# Patient Record
Sex: Male | Born: 1980 | Race: Black or African American | Hispanic: No | Marital: Married | Smoking: Never smoker
Health system: Southern US, Community
[De-identification: ages and names within clinical notes are randomized; demographics above are authoritative.]

## PROBLEM LIST (undated history)

## (undated) DIAGNOSIS — R030 Elevated blood-pressure reading, without diagnosis of hypertension: Secondary | ICD-10-CM

## (undated) DIAGNOSIS — Z8669 Personal history of other diseases of the nervous system and sense organs: Secondary | ICD-10-CM

## (undated) HISTORY — DX: Elevated blood-pressure reading, without diagnosis of hypertension: R03.0

## (undated) HISTORY — DX: Personal history of other diseases of the nervous system and sense organs: Z86.69

---

## 2015-06-15 ENCOUNTER — Other Ambulatory Visit: Payer: Self-pay | Admitting: Occupational Medicine

## 2015-06-15 ENCOUNTER — Ambulatory Visit (HOSPITAL_COMMUNITY)
Admission: RE | Admit: 2015-06-15 | Discharge: 2015-06-15 | Disposition: A | Discharge status: Home / Self Care | Payer: Self-pay | Source: Ambulatory Visit | Attending: Occupational Medicine | Admitting: Occupational Medicine

## 2015-06-15 DIAGNOSIS — A159 Respiratory tuberculosis unspecified: Secondary | ICD-10-CM

## 2016-02-04 ENCOUNTER — Emergency Department (HOSPITAL_COMMUNITY)
Admission: EM | Admit: 2016-02-04 | Discharge: 2016-02-05 | Disposition: A | Discharge status: Home / Self Care | Payer: Worker's Compensation | Attending: Emergency Medicine | Admitting: Emergency Medicine

## 2016-02-04 ENCOUNTER — Encounter (HOSPITAL_COMMUNITY): Payer: Self-pay | Admitting: Emergency Medicine

## 2016-02-04 DIAGNOSIS — S81011A Laceration without foreign body, right knee, initial encounter: Secondary | ICD-10-CM | POA: Diagnosis not present

## 2016-02-04 DIAGNOSIS — IMO0002 Reserved for concepts with insufficient information to code with codable children: Secondary | ICD-10-CM

## 2016-02-04 DIAGNOSIS — Z23 Encounter for immunization: Secondary | ICD-10-CM | POA: Diagnosis not present

## 2016-02-04 DIAGNOSIS — W208XXA Other cause of strike by thrown, projected or falling object, initial encounter: Secondary | ICD-10-CM | POA: Insufficient documentation

## 2016-02-04 DIAGNOSIS — Y9389 Activity, other specified: Secondary | ICD-10-CM | POA: Insufficient documentation

## 2016-02-04 DIAGNOSIS — Y9289 Other specified places as the place of occurrence of the external cause: Secondary | ICD-10-CM | POA: Insufficient documentation

## 2016-02-04 DIAGNOSIS — Y990 Civilian activity done for income or pay: Secondary | ICD-10-CM | POA: Insufficient documentation

## 2016-02-04 NOTE — ED Notes (Signed)
Pt states he was at work and a Midwife block fell on his right leg  Pt has a laceration noted to the right knee  Bleeding controlled  Pt states he is getting where he cannot feel his leg anymore  Pt is able to move his leg but states it hurts

## 2016-02-05 ENCOUNTER — Emergency Department (HOSPITAL_COMMUNITY): Payer: Worker's Compensation

## 2016-02-05 ENCOUNTER — Encounter (HOSPITAL_COMMUNITY): Payer: Self-pay | Admitting: Emergency Medicine

## 2016-02-05 MED ORDER — NAPROXEN 500 MG PO TABS: 500.0000 mg | ORAL_TABLET | Freq: Two times a day (BID) | ORAL | Status: DC

## 2016-02-05 MED ORDER — BACITRACIN ZINC 500 UNIT/GM EX OINT
TOPICAL_OINTMENT | CUTANEOUS | Status: AC
  Filled 2016-02-05: qty 0.9

## 2016-02-05 MED ORDER — LIDOCAINE-EPINEPHRINE-TETRACAINE (LET) SOLUTION
3.0000 mL | Freq: Once | NASAL | Status: AC
  Administered 2016-02-05: 3 mL via TOPICAL
  Filled 2016-02-05: qty 3

## 2016-02-05 MED ORDER — TETANUS-DIPHTH-ACELL PERTUSSIS 5-2.5-18.5 LF-MCG/0.5 IM SUSP
0.5000 mL | Freq: Once | INTRAMUSCULAR | Status: AC
  Administered 2016-02-05: 0.5 mL via INTRAMUSCULAR
  Filled 2016-02-05: qty 0.5

## 2016-02-05 NOTE — ED Notes (Signed)
Pt. Wound wrapped with 4 x 4 gauze and small kerlix with tape applied.

## 2016-02-05 NOTE — ED Provider Notes (Signed)
CSN: [id number]     Arrival date & time 02/04/16  2327 History  By signing my name below, I, Shane Gillespie, attest that this documentation has been prepared under the direction and in the presence of Shane Throckmorton, MD. Electronically Signed: Elon Gillespie, ED Scribe. 02/05/2016. 1:08 AM.    Chief Complaint  Patient presents with  . Leg Injury   Patient is a 35 y.o. male presenting with knee pain. The history is provided by the patient. No language interpreter was used.  Knee Pain Location:  Knee Injury: yes   Mechanism of injury comment:  Butcher block fell on the knee Knee location:  R knee Pain details:    Quality:  Aching   Radiates to:  Does not radiate   Severity:  Moderate   Onset quality:  Sudden   Timing:  Constant   Progression:  Unchanged Chronicity:  New Foreign body present:  No foreign bodies Tetanus status:  Out of date Prior injury to area:  No Relieved by:  Nothing Worsened by:  Nothing tried Ineffective treatments:  None tried Associated symptoms: no back pain, no decreased ROM, no muscle weakness, no stiffness and no tingling   Risk factors: no concern for non-accidental trauma    HPI Comments: Shane Gillespie is a 35 y.o. male who presents to the Emergency Department complaining of right knee injury sustained PTA.  The patient reports a heavy counter top Print production planner block") fell, hitting the right knee and causing a laceration.  Last tetanus unknown.    History reviewed. No pertinent past medical history. History reviewed. No pertinent past surgical history. History reviewed. No pertinent family history. Social History  Substance Use Topics  . Smoking status: Never Smoker   . Smokeless tobacco: None  . Alcohol Use: No    Review of Systems  Musculoskeletal: Negative for back pain and stiffness.  All other systems reviewed and are negative.  A complete 10 system review of systems was obtained and all systems are negative except as noted in the HPI and PMH.    Allergies  Review of patient's allergies indicates no known allergies.  Home Medications   Prior to Admission medications   Not on File   BP 114/83 mmHg  Pulse 80  Temp(Src) 98.4 F (36.9 C) (Oral)  Resp 20  SpO2 100% Physical Exam  Constitutional: He is oriented to person, place, and time. He appears well-developed and well-nourished. No distress.  HENT:  Head: Normocephalic and atraumatic.  Mouth/Throat: Oropharynx is clear and moist.  Eyes: Conjunctivae and EOM are normal. Pupils are equal, round, and reactive to light.  No battle sign or raccoon eyes.   Neck: Normal range of motion. Neck supple. No tracheal deviation present.  Cardiovascular: Normal rate and regular rhythm.   Pulmonary/Chest: Effort normal and breath sounds normal. No respiratory distress.  Abdominal: Soft. Bowel sounds are normal. There is no tenderness. There is no rebound and no guarding.  Musculoskeletal: Normal range of motion.       Right knee: He exhibits laceration. He exhibits normal range of motion, no effusion, no ecchymosis, no erythema, normal alignment, no LCL laxity, normal patellar mobility, no bony tenderness, normal meniscus and no MCL laxity. No tenderness found. No medial joint line, no lateral joint line, no MCL, no LCL and no patellar tendon tenderness noted.       Right ankle: Normal. Achilles tendon normal.       Right lower leg: Normal.       Right foot:  Normal.  NVI intact right foot.  All compartments of right lower extremity are soft.  2+ DP.  Intact achilles.  Sensation intact.  Patella and quadricept tendons intact.    Neurological: He is alert and oriented to person, place, and time. He displays normal reflexes.  Skin: Skin is warm and dry.  1" laceration over the patella on the right.    Psychiatric: He has a normal mood and affect. His behavior is normal.  Nursing note and vitals reviewed.   ED Course  Procedures (including critical care time)  DIAGNOSTIC  STUDIES: Oxygen Saturation is 100% on RA, normal by my interpretation.    COORDINATION OF CARE:  1:15 AM Discussed plan to order imaging of right knee and staple laceration.  Patient acknowledges and agrees with plan.    Labs Review Labs Reviewed - No data to display  Imaging Review No results found. I have personally reviewed and evaluated these images and lab results as part of my medical decision-making.   EKG Interpretation None      MDM   Final diagnoses:  None    LACERATION REPAIR Performed by: Shane Gillespie Authorized by: Shane Gillespie Consent: Verbal consent obtained. Risks and benefits: risks, benefits and alternatives were discussed Consent given by: patient Patient identity confirmed: provided demographic data Prepped and Draped in normal sterile fashion Wound explored  Laceration Location: right knee cap  Laceration Length: 2.54 cm  No Foreign Bodies seen or palpated  Anesthesia: local infiltration  LET topically    Irrigation method: syringe Amount of cleaning: standard  Skin closure: staples  Number of sutures:4   Patient tolerance: Patient tolerated the procedure well with no immediate complications.  I personally performed the services described in this documentation, which was scribed in my presence. The recorded information has been reviewed and is accurate.   Staples removal at urgent care in 10 days   Shane Lipari, MD 02/05/16 [phone]

## 2016-02-14 ENCOUNTER — Encounter (HOSPITAL_COMMUNITY): Payer: Self-pay | Admitting: Emergency Medicine

## 2016-02-14 ENCOUNTER — Emergency Department (INDEPENDENT_AMBULATORY_CARE_PROVIDER_SITE_OTHER)
Admission: EM | Admit: 2016-02-14 | Discharge: 2016-02-14 | Disposition: A | Discharge status: Home / Self Care | Payer: Worker's Compensation | Source: Home / Self Care | Attending: Family Medicine | Admitting: Family Medicine

## 2016-02-14 DIAGNOSIS — S81011S Laceration without foreign body, right knee, sequela: Secondary | ICD-10-CM

## 2016-02-14 NOTE — ED Notes (Signed)
The patient presented to the [facility] to have staples removed from his right thigh that were placed on 02/04/2016 at [facility] ED.

## 2016-02-14 NOTE — Discharge Instructions (Signed)
It was a pleasure to see you today.  The four staples were removed from your right knee.   Please return if you notice pain, redness, or discharge from the wound site.

## 2016-02-14 NOTE — ED Provider Notes (Signed)
CSN: [id number]     Arrival date & time 02/14/16  1602 History   First MD Initiated Contact with Patient 02/14/16 1829     Chief Complaint  Patient presents with  . Suture / Staple Removal   (Consider location/radiation/quality/duration/timing/severity/associated sxs/prior Treatment) Patient is a 35 y.o. male presenting with suture removal. The history is provided by the patient. No language interpreter was used.  Suture / Staple Removal  Patient here for follow up of laceration to RIGHT knee; butcher block fell on his knee 10 days ago.  Had 4 staples placed at that time in the ED.  Here as per instructions to have staples removed.   Has been able to bear weight and walk normally. No pain or discharge, no purulence from the knee.   History reviewed. No pertinent past medical history. History reviewed. No pertinent past surgical history. History reviewed. No pertinent family history. Social History  Substance Use Topics  . Smoking status: Never Smoker   . Smokeless tobacco: None  . Alcohol Use: No    Review of Systems  Constitutional: Negative for fever, chills, activity change and fatigue.  All other systems reviewed and are negative.   Allergies  Review of patient's allergies indicates no known allergies.  Home Medications   Prior to Admission medications   Medication Sig Start Date End Date Taking? Authorizing Provider  naproxen (NAPROSYN) 500 MG tablet Take 1 tablet (500 mg total) by mouth 2 (two) times daily. 02/05/16   April Palumbo, MD   Meds Ordered and Administered this Visit  Medications - No data to display  BP 119/67 mmHg  Pulse 65  Temp(Src) 98.3 F (36.8 C) (Oral)  Resp 16  SpO2 100% No data found.   Physical Exam  Constitutional: He appears well-developed and well-nourished. No distress.  Musculoskeletal:  RIGHT KNEE laceration clean and dry, with 4 staples in place.   Staples removed without difficulty.   Skin: He is not diaphoretic.    ED  Course  Procedures (including critical care time)  Labs Review Labs Reviewed - No data to display  Imaging Review No results found.   Visual Acuity Review  Right Eye Distance:   Left Eye Distance:   Bilateral Distance:    Right Eye Near:   Left Eye Near:    Bilateral Near:         MDM   1. Knee laceration, right, sequela    RIGHT KNEE LACERATION, s/p staples (4).  All 4 staples removed today.  Discussed reasons to return to [facility] for further evaluation.   Paula Compton, MD    Barbaraann Barthel, MD 02/14/16 [phone]

## 2016-02-20 ENCOUNTER — Ambulatory Visit (INDEPENDENT_AMBULATORY_CARE_PROVIDER_SITE_OTHER): Payer: Self-pay | Admitting: Urgent Care

## 2016-02-20 VITALS — BP 124/82 | HR 64 | Temp 98.4°F | Resp 12 | Ht 68.0 in | Wt 213.0 lb

## 2016-02-20 DIAGNOSIS — Z202 Contact with and (suspected) exposure to infections with a predominantly sexual mode of transmission: Secondary | ICD-10-CM

## 2016-02-20 LAB — HIV ANTIBODY (ROUTINE TESTING W REFLEX): HIV 1&2 Ab, 4th Generation: NONREACTIVE

## 2016-02-20 MED ORDER — METRONIDAZOLE 500 MG PO TABS: 500.0000 mg | ORAL_TABLET | Freq: Two times a day (BID) | ORAL | Status: DC

## 2016-02-20 NOTE — Progress Notes (Signed)
    MRN: [id number] DOB: [birth year]  Subjective:   Shane Gillespie is a 35 y.o. male presenting for chief complaint of Exposure to STD  Reports that his wife tested positive for Trichomonas vaginitis last week. Admits intermittent penile discharge. He reports monogamy on his end. Denies fever, genital rashes, n/v, abdominal pain, testicular pain, inguinal pain. He would like complete STI testing today.  Shane Gillespie has a current medication list which includes the following prescription(s): metronidazole and naproxen. Also has No Known Allergies.  Shane Gillespie  has no past medical history on file. Also  has no past surgical history on file.  Objective:   Vitals: BP 124/82 mmHg  Pulse 64  Temp(Src) 98.4 F (36.9 C) (Oral)  Resp 12  Ht  (1.727 m)  Wt 213 lb (96.616 kg)  BMI 32.39 kg/m2  SpO2 98%  Physical Exam  Constitutional: He is oriented to person, place, and time. He appears well-developed and well-nourished.  Cardiovascular: Normal rate.   Pulmonary/Chest: Effort normal.  Genitourinary: Right testis shows no mass, no swelling and no tenderness. Left testis shows no mass, no swelling and no tenderness. Circumcised. No phimosis, paraphimosis, hypospadias, penile erythema or penile tenderness. No discharge found.  Lymphadenopathy: No inguinal adenopathy noted on the right or left side.  Neurological: He is alert and oriented to person, place, and time.  Skin: Skin is warm and dry.   Assessment and Plan :   1. STD exposure 2. Trichomonas contact - Labs pending, start Flagyl to cover for Trichomonas infection. F/u by phone with results.  Wallis Bamberg, PA-C [facility] [phone] 02/20/2016 11:34 AM

## 2016-02-20 NOTE — Patient Instructions (Signed)
Trichomoniasis Trichomoniasis is an infection caused by an organism called Trichomonas. The infection can affect both women and men. In women, the outer male genitalia and the vagina are affected. In men, the penis is mainly affected, but the prostate and other reproductive organs can also be involved. Trichomoniasis is a sexually transmitted infection (STI) and is most often passed to another person through sexual contact.  RISK FACTORS  Having unprotected sexual intercourse.  Having sexual intercourse with an infected partner. SIGNS AND SYMPTOMS  Symptoms of trichomoniasis in women include:  Abnormal gray-green frothy vaginal discharge.  Itching and irritation of the vagina.  Itching and irritation of the area outside the vagina. Symptoms of trichomoniasis in men include:   Penile discharge with or without pain.  Pain during urination. This results from inflammation of the urethra. DIAGNOSIS  Trichomoniasis may be found during a Pap test or physical exam. Your health care provider may use one of the following methods to help diagnose this infection:  Testing the pH of the vagina with a test tape.  Using a vaginal swab test that checks for the Trichomonas organism. A test is available that provides results within a few minutes.  Examining a urine sample.  Testing vaginal secretions. Your health care provider may test you for other STIs, including HIV. TREATMENT   You may be given medicine to fight the infection. Women should inform their health care provider if they could be or are pregnant. Some medicines used to treat the infection should not be taken during pregnancy.  Your health care provider may recommend over-the-counter medicines or creams to decrease itching or irritation.  Your sexual partner will need to be treated if infected.  Your health care provider may test you for infection again 3 months after treatment. HOME CARE INSTRUCTIONS   Take medicines only as  directed by your health care provider.  Take over-the-counter medicine for itching or irritation as directed by your health care provider.  Do not have sexual intercourse while you have the infection.  Women should not douche or wear tampons while they have the infection.  Discuss your infection with your partner. Your partner may have gotten the infection from you, or you may have gotten it from your partner.  Have your sex partner get examined and treated if necessary.  Practice safe, informed, and protected sex.  See your health care provider for other STI testing. SEEK MEDICAL CARE IF:   You still have symptoms after you finish your medicine.  You develop abdominal pain.  You have pain when you urinate.  You have bleeding after sexual intercourse.  You develop a rash.  Your medicine makes you sick or makes you throw up (vomit). MAKE SURE YOU:  Understand these instructions.  Will watch your condition.  Will get help right away if you are not doing well or get worse.   This information is not intended to replace advice given to you by your health care provider. Make sure you discuss any questions you have with your health care provider.   Document Released: 06/07/2001 Document Revised: 01/02/2015 Document Reviewed: 09/23/2013 Elsevier Interactive Patient Education 2016 [employer]  

## 2016-02-22 LAB — GC/CHLAMYDIA PROBE AMP
CT Probe RNA: NOT DETECTED
GC Probe RNA: NOT DETECTED

## 2016-02-24 LAB — FLUORESCENT TREPONEMAL AB(FTA)-IGG-BLD: FLUORESCENT TREPONEMAL ABS: NONREACTIVE

## 2016-02-24 LAB — RPR: RPR: REACTIVE — AB

## 2016-02-24 LAB — RPR TITER: RPR Titer: 1:2 {titer}

## 2016-04-20 ENCOUNTER — Emergency Department (HOSPITAL_COMMUNITY)
Admission: EM | Admit: 2016-04-20 | Discharge: 2016-04-20 | Disposition: A | Discharge status: Home / Self Care | Payer: Worker's Compensation | Attending: Emergency Medicine | Admitting: Emergency Medicine

## 2016-04-20 ENCOUNTER — Encounter (HOSPITAL_COMMUNITY): Payer: Self-pay | Admitting: Emergency Medicine

## 2016-04-20 DIAGNOSIS — M25561 Pain in right knee: Secondary | ICD-10-CM | POA: Insufficient documentation

## 2016-04-20 NOTE — ED Notes (Signed)
Patient with increased pain in right leg for the last month.  He states that about a month ago, something fell on his leg on his right knee at his place of employment.  He states that it is worse when he tries to bend the right knee or kneel on it.

## 2016-04-20 NOTE — ED Provider Notes (Signed)
CSN: [id number]     Arrival date & time 04/20/16  0402 History   First MD Initiated Contact with Patient 04/20/16 0654     Chief Complaint  Patient presents with  . Leg Pain   (Consider location/radiation/quality/duration/timing/severity/associated sxs/prior Treatment) HPI 35 y.o. male presents to the Emergency Department today complaining of continued right leg pain s/p butcher block falling on his knee several weeks ago. Notes pain is 3-4/10 and only occurs with flexion and extension of knee. Has tried tylenol with minimal relief. Pt able to ambulate, but notes discomfort when bending down with the knee to pick things up. No numbness/tingling. No other symptoms noted.   History reviewed. No pertinent past medical history. History reviewed. No pertinent past surgical history. No family history on file. Social History  Substance Use Topics  . Smoking status: Never Smoker   . Smokeless tobacco: None  . Alcohol Use: No    Review of Systems ROS reviewed and all are negative for acute change except as noted in the HPI.  Allergies  Review of patient's allergies indicates no known allergies.  Home Medications   Prior to Admission medications   Medication Sig Start Date End Date Taking? Authorizing Provider  metroNIDAZOLE (FLAGYL) 500 MG tablet Take 1 tablet (500 mg total) by mouth 2 (two) times daily with a meal. DO NOT CONSUME ALCOHOL WHILE TAKING THIS MEDICATION. Patient not taking: Reported on 04/20/2016 02/20/16   Wallis BambergMario Mani, PA-C  naproxen (NAPROSYN) 500 MG tablet Take 1 tablet (500 mg total) by mouth 2 (two) times daily. Patient not taking: Reported on 04/20/2016 02/05/16   April Palumbo, MD   BP 125/64 mmHg  Pulse 86  Temp(Src) 99 F (37.2 C) (Oral)  Resp 18  Ht 5\' 6"  (1.676 m)  Wt 90.719 kg  BMI 32.30 kg/m2  SpO2 94%   Physical Exam  Constitutional: He is oriented to person, place, and time. He appears well-developed and well-nourished.  HENT:  Head: Normocephalic and  atraumatic.  Eyes: EOM are normal.  Neck: Normal range of motion.  Cardiovascular: Normal rate and regular rhythm.   Pulmonary/Chest: Effort normal.  Abdominal: Soft.  Musculoskeletal: Normal range of motion.       Right knee: He exhibits normal range of motion, no swelling, no effusion, no ecchymosis, no deformity, no laceration, no erythema, normal alignment, no LCL laxity, normal patellar mobility and no bony tenderness. No tenderness found. No medial joint line tenderness noted.  Negative anterior/poster drawer bilaterally. Negative ballottement test. No varus or valgus laxity. No crepitus. Pain with flexion and extension. TTP along supra patella where laceration scar is noted. Distal pulses intact. Cap refill <2sec. Motor/sensation intact    Neurological: He is alert and oriented to person, place, and time.  Skin: Skin is warm and dry.  Psychiatric: He has a normal mood and affect. His behavior is normal. Thought content normal.  Nursing note and vitals reviewed.  ED Course  Procedures (including critical care time) Labs Review Labs Reviewed - No data to display  Imaging Review No results found. I have personally reviewed and evaluated these images and lab results as part of my medical decision-making.   EKG Interpretation None      MDM  I have reviewed and evaluated the relevant imaging studies.  I have reviewed the relevant previous healthcare records. I obtained HPI from historian.  ED Course:  Assessment: Pt is a 35yM who presents with continued right knee pain x 1 month s/p blcok falling on knee.  On exam, pt in NAD. Nontoxic/nonseptic appearing. VSS. Afebrile. Suspect meniscal injury due to pain with ROM of knee. No pain at rest. Pt has not tried antiinflammatory at this time. Will suggest NSAIDs. Given Ortho referral if pain does not resolve with NSAID treatment. Previous imaging unremarkable with no acute fractures. Plan is to DC Home. At time of discharge, Patient is in  no acute distress. Vital Signs are stable. Patient is able to ambulate. Patient able to tolerate PO.    Disposition/Plan:  DC home Additional Verbal discharge instructions given and discussed with patient.  Pt Instructed to f/u with PCP in the next week for evaluation and treatment of symptoms. Return precautions given Pt acknowledges and agrees with plan  Supervising Physician Jerelyn Scott, MD   Final diagnoses:  Right knee pain       Audry Pili, PA-C 04/20/16 7829  Jerelyn Scott, MD 04/20/16 [phone]

## 2016-04-20 NOTE — Discharge Instructions (Signed)
Please read and follow all provided instructions.  Your diagnoses today include:  1. Right knee pain    Tests performed today include:  Vital signs. See below for your results today.   Medications prescribed:   None  Home care instructions:  Follow any educational materials contained in this packet.  Follow-up instructions: Please follow-up with Orthopedics for further evaluation of symptoms and treatment if symptoms do not improve with NSAID treatment   Return instructions:   Please return to the Emergency Department if you do not get better, if you get worse, or new symptoms OR  - Fever (temperature greater than 101.34F)  - Bleeding that does not stop with holding pressure to the area    -Severe pain (please note that you may be more sore the day after your accident)  - Chest Pain  - Difficulty breathing  - Severe nausea or vomiting  - Inability to tolerate food and liquids  - Passing out  - Skin becoming red around your wounds  - Change in mental status (confusion or lethargy)  - New numbness or weakness     Please return if you have any other emergent concerns.  Additional Information:  Your vital signs today were: BP 125/64 mmHg   Pulse 86   Temp(Src) 99 F (37.2 C) (Oral)   Resp 18   Ht 5\' 6"  (1.676 m)   Wt 90.719 kg   BMI 32.30 kg/m2   SpO2 94% If your blood pressure (BP) was elevated above 135/85 this visit, please have this repeated by your doctor within one month. ---------------

## 2017-08-15 IMAGING — CR DG KNEE COMPLETE 4+V*R*
4 series · 4 of 4 positions shown · non-contrast
Comparison: None.

CLINICAL DATA: [place] Mugesi fell and struck patient on right knee,
with laceration across the patella. Initial encounter.

EXAM:
RIGHT KNEE - COMPLETE 4+ VIEW

[x knee ap right]
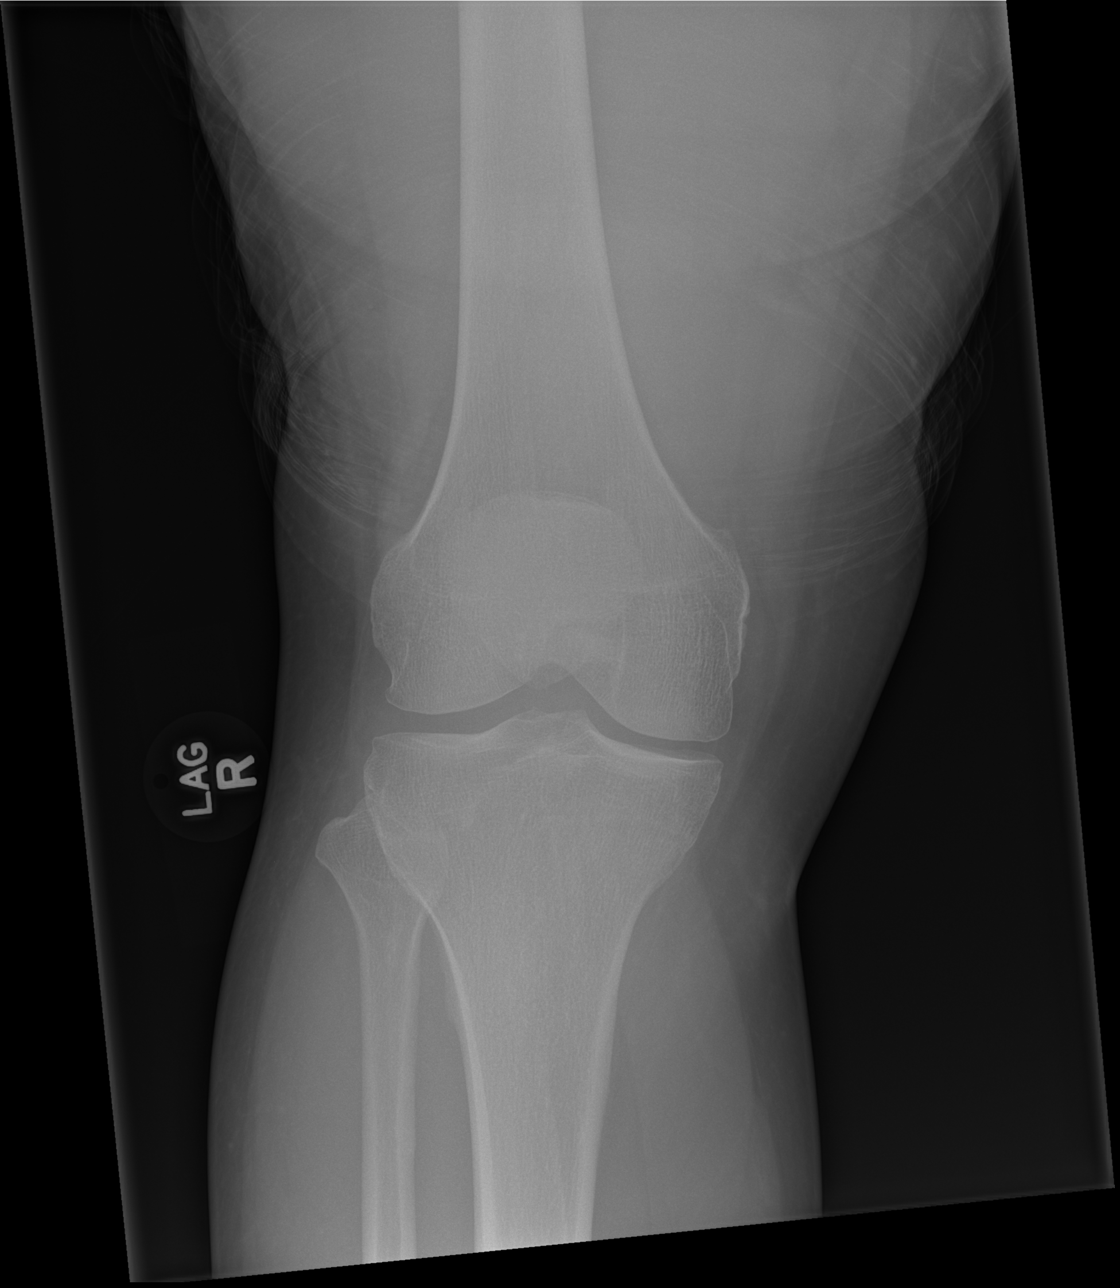

[x knee obl right (1 of 2)]
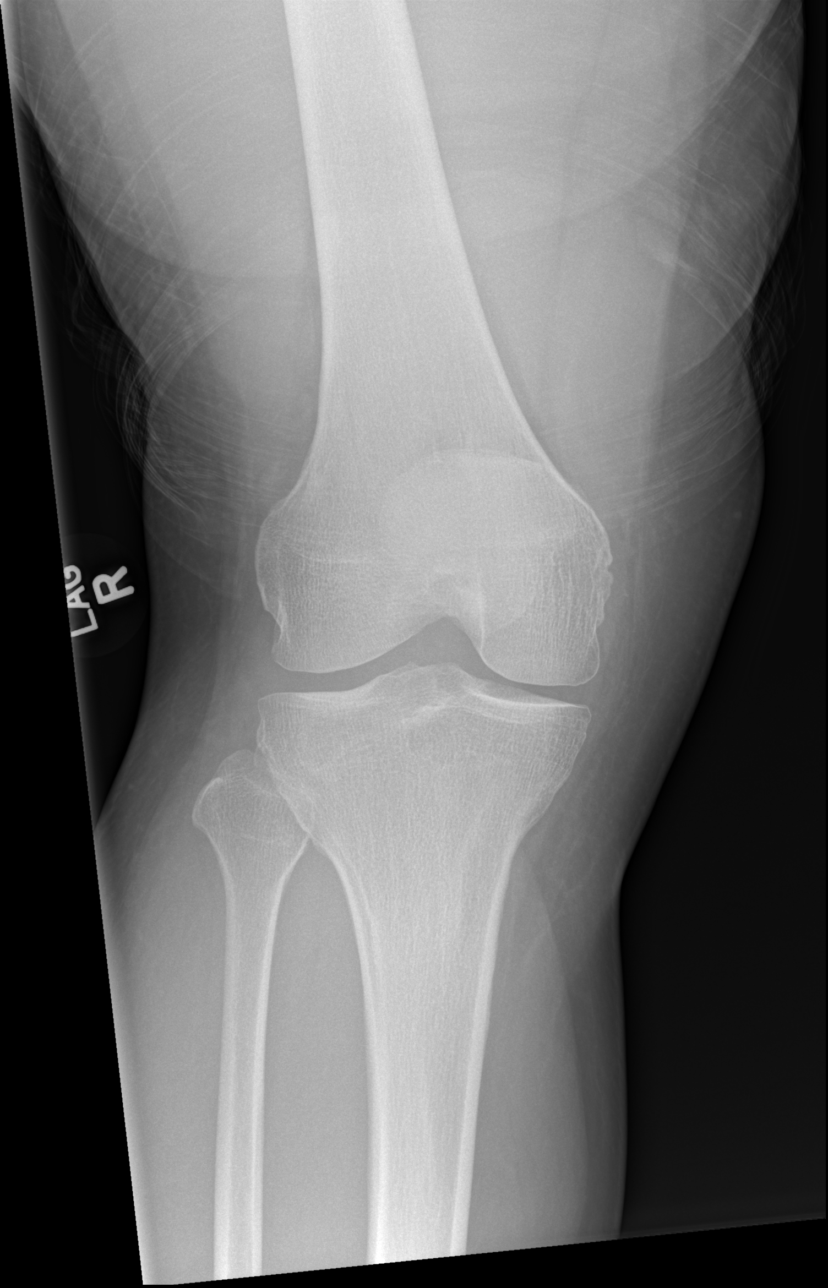

[x knee obl right (2 of 2)]
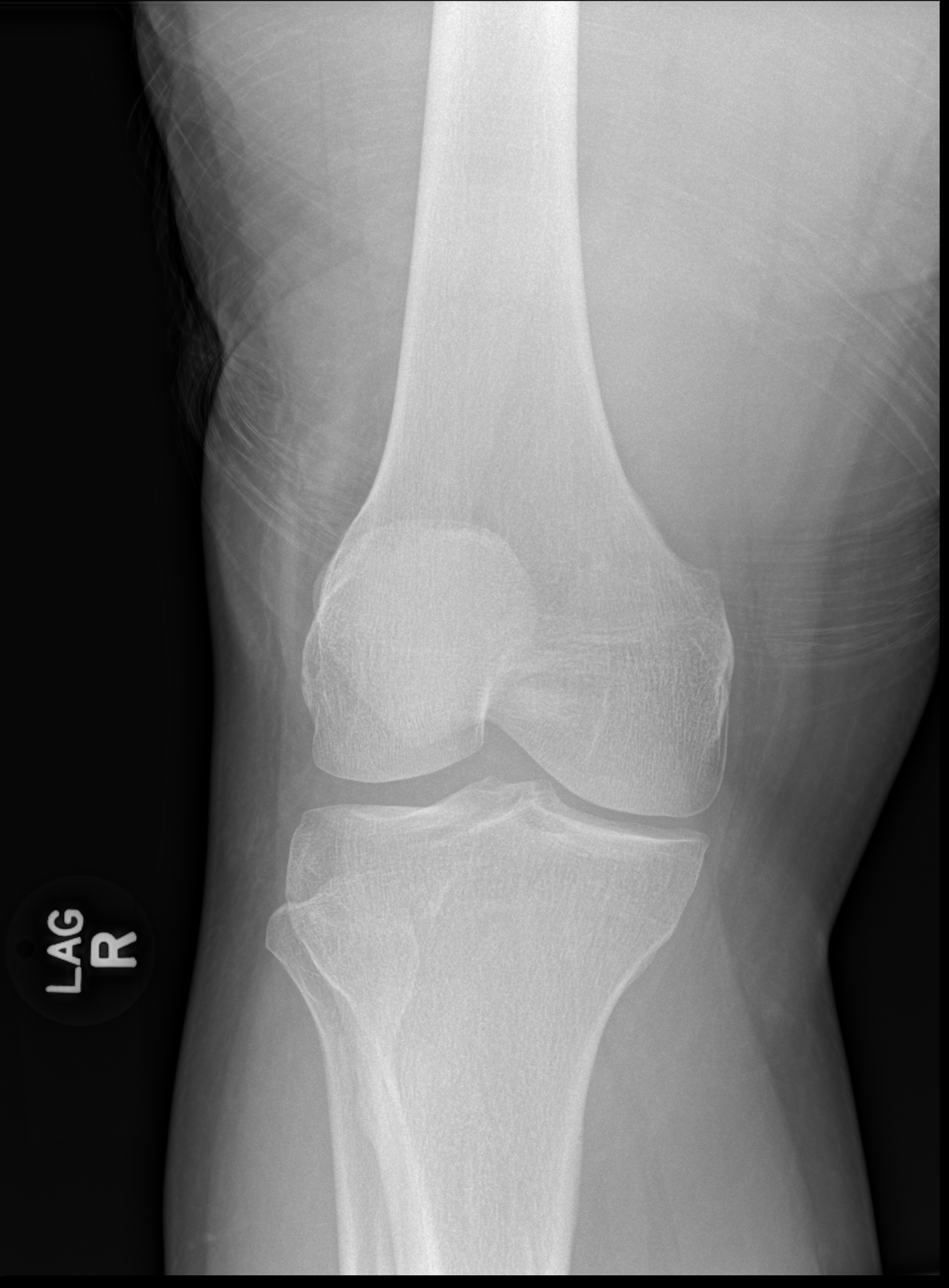

[x knee lat right]
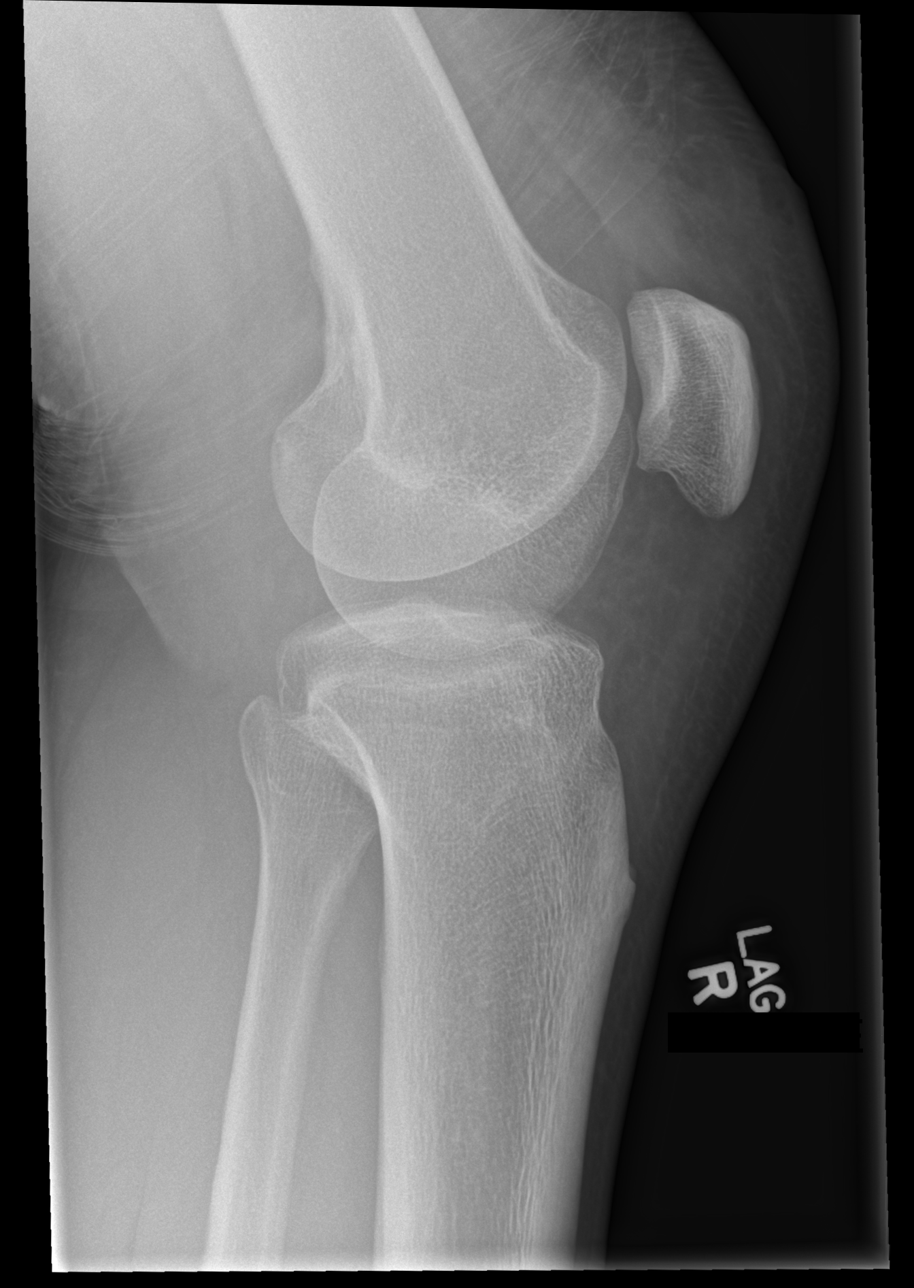

[4 of 4 positions shown; findings below may reference images not displayed]

FINDINGS: There is no evidence of fracture or dislocation. The joint spaces
are preserved. No significant degenerative change is seen; the
patellofemoral joint is grossly unremarkable in appearance.

No significant joint effusion is seen. The known soft tissue
laceration is not well characterized on radiograph. No radiopaque
foreign bodies are seen.
IMPRESSION: No evidence of fracture or dislocation.

## 2019-06-25 ENCOUNTER — Ambulatory Visit: Payer: Self-pay | Admitting: Registered Nurse

## 2019-07-09 ENCOUNTER — Ambulatory Visit (INDEPENDENT_AMBULATORY_CARE_PROVIDER_SITE_OTHER): Payer: Managed Care, Other (non HMO) | Admitting: Registered Nurse

## 2019-07-09 ENCOUNTER — Other Ambulatory Visit: Payer: Self-pay

## 2019-07-09 ENCOUNTER — Encounter: Payer: Self-pay | Admitting: Registered Nurse

## 2019-07-09 VITALS — BP 113/70 | HR 68 | Temp 98.0°F | Resp 18 | Ht 67.91 in | Wt 224.0 lb

## 2019-07-09 DIAGNOSIS — Z0000 Encounter for general adult medical examination without abnormal findings: Secondary | ICD-10-CM

## 2019-07-09 DIAGNOSIS — Z130 Encounter for screening for diseases of the blood and blood-forming organs and certain disorders involving the immune mechanism: Secondary | ICD-10-CM

## 2019-07-09 DIAGNOSIS — Z13220 Encounter for screening for lipoid disorders: Secondary | ICD-10-CM | POA: Diagnosis not present

## 2019-07-09 DIAGNOSIS — Z13228 Encounter for screening for other metabolic disorders: Secondary | ICD-10-CM

## 2019-07-09 DIAGNOSIS — L0292 Furuncle, unspecified: Secondary | ICD-10-CM | POA: Insufficient documentation

## 2019-07-09 DIAGNOSIS — L0293 Carbuncle, unspecified: Secondary | ICD-10-CM | POA: Diagnosis not present

## 2019-07-09 DIAGNOSIS — Z0001 Encounter for general adult medical examination with abnormal findings: Secondary | ICD-10-CM | POA: Diagnosis not present

## 2019-07-09 DIAGNOSIS — Z1329 Encounter for screening for other suspected endocrine disorder: Secondary | ICD-10-CM

## 2019-07-09 MED ORDER — DOXYCYCLINE HYCLATE 100 MG PO TABS: 100.0000 mg | ORAL_TABLET | Freq: Two times a day (BID) | ORAL | 2 refills | Status: DC

## 2019-07-09 NOTE — Progress Notes (Signed)
Established Patient Office Visit  Subjective:  Patient ID: Shane Gillespie, male    DOB: 1981/08/05  Age: 38 y.o. MRN: [id number]  CC:  Chief Complaint  Patient presents with  . Establish Care    needs a pcp to manage care     HPI Shane Gillespie presents for visit to establish care.   Complains today of boil on his leg - he states he has gotten them in his armpits before and has been on metronidazole in the past for them. He states it has had some purulent discharge and has felt warm. Denies systemic symptoms.  Otherwise, reports that he feels in good health. Works at [employer] in a physical job, states that he has occasional back pain that he thinks is muscular in nature. Denies imaging or PT referral today. We discussed lower back stretching.  History reviewed. No pertinent past medical history.  History reviewed. No pertinent surgical history.  History reviewed. No pertinent family history.  Social History   Socioeconomic History  . Marital status: Married    Spouse name: Not on file  . Number of children: 3  . Years of education: Not on file  . Highest education level: Not on file  Occupational History  . Not on file  Social Needs  . Financial resource strain: Not hard at all  . Food insecurity    Worry: Never true    Inability: Never true  . Transportation needs    Medical: No    Non-medical: No  Tobacco Use  . Smoking status: Never Smoker  . Smokeless tobacco: Never Used  Substance and Sexual Activity  . Alcohol use: No  . Drug use: No  . Sexual activity: Not Currently  Lifestyle  . Physical activity    Days per week: 3 days    Minutes per session: 30 min  . Stress: To some extent  Relationships  . Social Herbalist on phone: Three times a week    Gets together: Twice a week    Attends religious service: More than 4 times per year    Active member of club or organization: No    Attends meetings of clubs or organizations: Never   Relationship status: Married  . Intimate partner violence    Fear of current or ex partner: No    Emotionally abused: No    Physically abused: No    Forced sexual activity: No  Other Topics Concern  . Not on file  Social History Narrative  . Not on file    Outpatient Medications Prior to Visit  Medication Sig Dispense Refill  . metroNIDAZOLE (FLAGYL) 500 MG tablet Take 1 tablet (500 mg total) by mouth 2 (two) times daily with a meal. DO NOT CONSUME ALCOHOL WHILE TAKING THIS MEDICATION. (Patient not taking: Reported on 04/20/2016) 14 tablet 0  . naproxen (NAPROSYN) 500 MG tablet Take 1 tablet (500 mg total) by mouth 2 (two) times daily. (Patient not taking: Reported on 04/20/2016) 30 tablet 0   No facility-administered medications prior to visit.     No Known Allergies  ROS Review of Systems  Constitutional: Negative.   HENT: Negative.   Eyes: Negative.   Respiratory: Negative.   Cardiovascular: Negative.   Gastrointestinal: Negative.   Endocrine: Negative.   Genitourinary: Negative.   Musculoskeletal: Negative.   Skin: Positive for wound.  Allergic/Immunologic: Negative.   Neurological: Negative.   Hematological: Negative.   Psychiatric/Behavioral: Negative.   All other systems reviewed and  are negative.     Objective:    Physical Exam  Constitutional: He is oriented to person, place, and time. He appears well-developed and well-nourished. No distress.  HENT:  Head: Normocephalic and atraumatic.  Right Ear: External ear normal.  Left Ear: External ear normal.  Nose: Nose normal.  Mouth/Throat: No oropharyngeal exudate.  Eyes: Pupils are equal, round, and reactive to light. Conjunctivae and EOM are normal. Right eye exhibits no discharge. Left eye exhibits no discharge. No scleral icterus.  Neck: Normal range of motion. Neck supple. No tracheal deviation present. No thyromegaly present.  Cardiovascular: Normal rate, regular rhythm, normal heart sounds and intact  distal pulses. Exam reveals no gallop and no friction rub.  No murmur heard. Pulmonary/Chest: Effort normal and breath sounds normal. No respiratory distress. He has no wheezes. He has no rales. He exhibits no tenderness.  Abdominal: Soft. Bowel sounds are normal. He exhibits no distension and no mass. There is no abdominal tenderness. There is no rebound and no guarding.  Musculoskeletal: Normal range of motion.        General: No tenderness, deformity or edema.  Lymphadenopathy:    He has no cervical adenopathy.  Neurological: He is alert and oriented to person, place, and time. No cranial nerve deficit.  Skin: Skin is warm and dry. No rash noted. He is not diaphoretic. No erythema. No pallor.     Psychiatric: He has a normal mood and affect. His behavior is normal. Judgment and thought content normal.  Nursing note and vitals reviewed.   BP 113/70   Pulse 68   Temp 98 F (36.7 C) (Oral)   Resp 18   Ht 5' 7.91" (1.725 m)   Wt 224 lb (101.6 kg)   SpO2 98%   BMI 34.15 kg/m  Wt Readings from Last 3 Encounters:  07/09/19 224 lb (101.6 kg)  04/20/16 200 lb (90.7 kg)  02/20/16 213 lb (96.6 kg)     There are no preventive care reminders to display for this patient.  There are no preventive care reminders to display for this patient.  No results found for: TSH No results found for: WBC, HGB, HCT, MCV, PLT No results found for: NA, K, CHLORIDE, CO2, GLUCOSE, BUN, CREATININE, BILITOT, ALKPHOS, AST, ALT, PROT, ALBUMIN, CALCIUM, ANIONGAP, EGFR, GFR No results found for: CHOL No results found for: HDL No results found for: LDLCALC No results found for: TRIG No results found for: CHOLHDL No results found for: HGBA1C    Assessment & Plan:   Problem List Items Addressed This Visit      Other   Recurrent boils   Relevant Medications   doxycycline (VIBRA-TABS) 100 MG tablet    Other Visit Diagnoses    Screening for endocrine, metabolic and immunity disorder    -  Primary    Relevant Orders   CBC with Differential/Platelet   Comprehensive metabolic panel   TSH   Lipid screening       Relevant Orders   Lipid panel      Meds ordered this encounter  Medications  . doxycycline (VIBRA-TABS) 100 MG tablet    Sig: Take 1 tablet (100 mg total) by mouth 2 (two) times daily. For 10 days when boil presents    Dispense:  20 tablet    Refill:  2    Order Specific Question:   Supervising Provider    Answer:   Forrest Moron [id number]    Follow-up: No follow-ups on file.   PLAN  Doxycycline '100mg'$  PO bid for 10 days for recurrent boils. Considered lancing, however, given physical nature of patient's job and past need for abx, decided abx would be preferable. Given 2 refills in case of recurrence.  Routine labs drawn, will follow up as warranted.  Physical exam with normal findings.  Patient encouraged to call clinic with any questions, comments, or concerns.    Maximiano Coss, NP

## 2019-07-09 NOTE — Patient Instructions (Signed)
° ° ° °  If you have lab work done today you will be contacted with your lab results within the next 2 weeks.  If you have not heard from us then please contact us. The fastest way to get your results is to register for My Chart. ° ° °IF you received an x-ray today, you will receive an invoice from [facility]. Please contact [facility] at [phone] with questions or concerns regarding your invoice.  ° °IF you received labwork today, you will receive an invoice from [facility]. Please contact [facility] at [phone] with questions or concerns regarding your invoice.  ° °Our billing staff will not be able to assist you with questions regarding bills from these companies. ° °You will be contacted with the lab results as soon as they are available. The fastest way to get your results is to activate your My Chart account. Instructions are located on the last page of this paperwork. If you have not heard from us regarding the results in 2 weeks, please contact this office. °  ° ° ° °

## 2019-07-10 LAB — CBC WITH DIFFERENTIAL/PLATELET
Basophils Absolute: 0 10*3/uL (ref 0.0–0.2)
Basos: 1 %
EOS (ABSOLUTE): 0.3 10*3/uL (ref 0.0–0.4)
Eos: 6 %
Hematocrit: 41.6 % (ref 37.5–51.0)
Hemoglobin: 13.5 g/dL (ref 13.0–17.7)
Immature Grans (Abs): 0 10*3/uL (ref 0.0–0.1)
Immature Granulocytes: 0 %
Lymphocytes Absolute: 2.2 10*3/uL (ref 0.7–3.1)
Lymphs: 48 %
MCH: 26.8 pg (ref 26.6–33.0)
MCHC: 32.5 g/dL (ref 31.5–35.7)
MCV: 83 fL (ref 79–97)
Monocytes Absolute: 0.5 10*3/uL (ref 0.1–0.9)
Monocytes: 11 %
Neutrophils Absolute: 1.5 10*3/uL (ref 1.4–7.0)
Neutrophils: 34 %
Platelets: 216 10*3/uL (ref 150–450)
RBC: 5.03 x10E6/uL (ref 4.14–5.80)
RDW: 13.6 % (ref 11.6–15.4)
WBC: 4.5 10*3/uL (ref 3.4–10.8)

## 2019-07-10 LAB — LIPID PANEL
Chol/HDL Ratio: 2.9 ratio (ref 0.0–5.0)
Cholesterol, Total: 164 mg/dL (ref 100–199)
HDL: 56 mg/dL (ref >39)
LDL Calculated: 98 mg/dL (ref 0–99)
Triglycerides: 51 mg/dL (ref 0–149)
VLDL Cholesterol Cal: 10 mg/dL (ref 5–40)

## 2019-07-10 LAB — COMPREHENSIVE METABOLIC PANEL
ALT: 28 IU/L (ref 0–44)
AST: 27 IU/L (ref 0–40)
Albumin/Globulin Ratio: 1.4 (ref 1.2–2.2)
Albumin: 4.1 g/dL (ref 4.0–5.0)
Alkaline Phosphatase: 61 IU/L (ref 39–117)
BUN/Creatinine Ratio: 9 (ref 9–20)
BUN: 10 mg/dL (ref 6–20)
Bilirubin Total: <0.2 mg/dL (ref 0.0–1.2)
CO2: 22 mmol/L (ref 20–29)
Calcium: 8.9 mg/dL (ref 8.7–10.2)
Chloride: 102 mmol/L (ref 96–106)
Creatinine, Ser: 1.1 mg/dL (ref 0.76–1.27)
GFR calc Af Amer: 99 mL/min/{1.73_m2} (ref >59)
GFR calc non Af Amer: 85 mL/min/{1.73_m2} (ref >59)
Globulin, Total: 3 g/dL (ref 1.5–4.5)
Glucose: 94 mg/dL (ref 65–99)
Potassium: 3.9 mmol/L (ref 3.5–5.2)
Sodium: 140 mmol/L (ref 134–144)
Total Protein: 7.1 g/dL (ref 6.0–8.5)

## 2019-07-10 LAB — TSH: TSH: 2.76 u[IU]/mL (ref 0.450–4.500)

## 2020-04-13 ENCOUNTER — Ambulatory Visit: Payer: Self-pay | Admitting: Registered Nurse

## 2020-04-16 ENCOUNTER — Ambulatory Visit: Payer: Managed Care, Other (non HMO) | Attending: Internal Medicine

## 2020-04-16 DIAGNOSIS — Z23 Encounter for immunization: Secondary | ICD-10-CM

## 2020-04-16 NOTE — Progress Notes (Signed)
   Covid-19 Vaccination Clinic  Name:  Shane Gillespie    MRN: [id number] DOB: 1981-11-09  04/16/2020  Shane Gillespie was observed post Covid-19 immunization for 15 minutes without incident. He was provided with Vaccine Information Sheet and instruction to access the V-Safe system.   Shane Gillespie was instructed to call 911 with any severe reactions post vaccine: Marland Kitchen Difficulty breathing  . Swelling of face and throat  . A fast heartbeat  . A bad rash all over body  . Dizziness and weakness   Immunizations Administered    Name Date Dose VIS Date Route   Pfizer COVID-19 Vaccine 04/16/2020 12:10 PM 0.3 mL 02/19/2019 Intramuscular   Manufacturer: [employer], [employer]   Lot: [id number]   NDC: [address]-7

## 2020-05-11 ENCOUNTER — Ambulatory Visit: Payer: Managed Care, Other (non HMO) | Attending: Internal Medicine

## 2020-05-11 DIAGNOSIS — Z23 Encounter for immunization: Secondary | ICD-10-CM

## 2020-05-11 NOTE — Progress Notes (Signed)
   Covid-19 Vaccination Clinic  Name:  Shane Gillespie    MRN: [id number] DOB: 1981/06/07  05/11/2020  [name] was observed post Covid-19 immunization for 15 minutes without incident. He was provided with Vaccine Information Sheet and instruction to access the V-Safe system.   [name] was instructed to call 911 with any severe reactions post vaccine: Marland Kitchen Difficulty breathing  . Swelling of face and throat  . A fast heartbeat  . A bad rash all over body  . Dizziness and weakness   Immunizations Administered    Name Date Dose VIS Date Route   Pfizer COVID-19 Vaccine 05/11/2020  8:30 AM 0.3 mL 02/19/2019 Intramuscular   Manufacturer: [employer], [employer]   Lot: [id number]   NDC: [address]-1

## 2020-05-19 ENCOUNTER — Other Ambulatory Visit: Payer: Self-pay

## 2020-05-19 ENCOUNTER — Encounter: Payer: Self-pay | Admitting: Registered Nurse

## 2020-05-19 ENCOUNTER — Telehealth (INDEPENDENT_AMBULATORY_CARE_PROVIDER_SITE_OTHER): Payer: Managed Care, Other (non HMO) | Admitting: Registered Nurse

## 2020-05-19 DIAGNOSIS — J069 Acute upper respiratory infection, unspecified: Secondary | ICD-10-CM | POA: Diagnosis not present

## 2020-05-19 MED ORDER — AZITHROMYCIN 250 MG PO TABS: ORAL_TABLET | ORAL | 0 refills | Status: DC

## 2020-05-19 NOTE — Progress Notes (Signed)
Telemedicine Encounter- SOAP NOTE Established Patient  This telephone encounter was conducted with the patient's (or proxy's) verbal consent via audio telecommunications: yes  Patient was instructed to have this encounter in a suitably private space; and to only have persons present to whom they give permission to participate. In addition, patient identity was confirmed by use of name plus two identifiers (DOB and address).  I discussed the limitations, risks, security and privacy concerns of performing an evaluation and management service by telephone and the availability of in person appointments. I also discussed with the patient that there may be a patient responsible charge related to this service. The patient expressed understanding and agreed to proceed.  I spent a total of 13 minutes talking with the patient or their proxy.  Chief Complaint  Patient presents with  . Covid Vaccine     patient states 5/17 afterwards he has been having sore throat , migraine , and a fever keeps coming and going since then. Per patient he has been feeling very fatigue and weak. He has been taking tylenol to help and feels like its not working a he say he is abusing the medication    Subjective   Shane Gillespie is a 39 y.o. established patient. Telephone visit today for vaccine side effects  HPI Second dose of covid vaccine on 05/11/20.  Since then having some sore throat, headaches, and feeling feverish. Feeling weak and fatigued. No nvd, chills, sweats, visual changes, chest pain, shob, doe, dependent edema, or claudication. Had mild flu like symptoms after first shot. No local rash or swelling at injection site. Arm soreness resolved within a day or two.  No hx of seasonal allergies.   Patient Active Problem List   Diagnosis Date Noted  . 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 No pertinent past medical history.  Current Outpatient Medications  Medication Sig Dispense Refill  .  doxycycline (VIBRA-TABS) 100 MG tablet Take 1 tablet (100 mg total) by mouth 2 (two) times daily. For 10 days when boil presents (Patient not taking: Reported on 05/19/2020) 20 tablet 2   No current facility-administered medications for this visit.    No Known Allergies  Social History   Socioeconomic History  . Marital status: Married    Spouse name: Not on file  . Number of children: 3  . Years of education: Not on file  . Highest education level: Not on file  Occupational History  . Not on file  Tobacco Use  . Smoking status: Never Smoker  . Smokeless tobacco: Never Used  Substance and Sexual Activity  . Alcohol use: No  . Drug use: No  . Sexual activity: Not Currently  Other Topics Concern  . Not on file  Social History Narrative  . Not on file   Social Determinants of Health   Financial Resource Strain: Low Risk   . Difficulty of Paying Living Expenses: Not hard at all  Food Insecurity: No Food Insecurity  . Worried About Charity fundraiser in the Last Year: Never true  . Ran Out of Food in the Last Year: Never true  Transportation Needs: No Transportation Needs  . Lack of Transportation (Medical): No  . Lack of Transportation (Non-Medical): No  Physical Activity: Insufficiently Active  . Days of Exercise per Week: 3 days  . Minutes of Exercise per Session: 30 min  Stress: Stress Concern Present  . Feeling of Stress : To some extent  Social Connections: Slightly Isolated  .  Frequency of Communication with Friends and Family: Three times a week  . Frequency of Social Gatherings with Friends and Family: Twice a week  . Attends Religious Services: More than 4 times per year  . Active Member of Clubs or Organizations: No  . Attends Banker Meetings: Never  . Marital Status: Married  Catering manager Violence: Not At Risk  . Fear of Current or Ex-Partner: No  . Emotionally Abused: No  . Physically Abused: No  . Sexually Abused: No    Review of  Systems  Constitutional: Positive for fever and malaise/fatigue. Negative for diaphoresis and weight loss.  HENT: Positive for sinus pain and sore throat. Negative for congestion, ear discharge, ear pain, hearing loss, nosebleeds and tinnitus.   Eyes: Negative.   Respiratory: Negative.  Negative for stridor.   Cardiovascular: Negative.   Gastrointestinal: Negative.   Genitourinary: Negative.   Musculoskeletal: Negative.   Skin: Negative.   Neurological: Positive for headaches. Negative for dizziness, tingling, tremors, sensory change, speech change, focal weakness, seizures, loss of consciousness and weakness.  Endo/Heme/Allergies: Negative.   Psychiatric/Behavioral: Negative.   All other systems reviewed and are negative.   Objective   Vitals as reported by the patient: There were no vitals filed for this visit.  [name] was seen today for covid vaccine .  Diagnoses and all orders for this visit:  Acute upper respiratory infection -     azithromycin (ZITHROMAX) 250 MG tablet; Take 2 tabs on first day. Then take 1 daily. Finish entire supply.   PLAN  Concern for upper respiratory infection. Given z pak  Suggest tylenol 500mg  PO q4h regularly for next 3-4 days  Return precautions reviewed  Patient encouraged to call clinic with any questions, comments, or concerns.  I discussed the assessment and treatment plan with the patient. The patient was provided an opportunity to ask questions and all were answered. The patient agreed with the plan and demonstrated an understanding of the instructions.   The patient was advised to call back or seek an in-person evaluation if the symptoms worsen or if the condition fails to improve as anticipated.  I provided 13 minutes of non-face-to-face time during this encounter.  , NP  [facility] at [facility]

## 2020-05-19 NOTE — Patient Instructions (Signed)
° ° ° °  If you have lab work done today you will be contacted with your lab results within the next 2 weeks.  If you have not heard from us then please contact us. The fastest way to get your results is to register for My Chart. ° ° °IF you received an x-ray today, you will receive an invoice from [facility]. Please contact [facility] at [phone] with questions or concerns regarding your invoice.  ° °IF you received labwork today, you will receive an invoice from [facility]. Please contact [facility] at [phone] with questions or concerns regarding your invoice.  ° °Our billing staff will not be able to assist you with questions regarding bills from these companies. ° °You will be contacted with the lab results as soon as they are available. The fastest way to get your results is to activate your My Chart account. Instructions are located on the last page of this paperwork. If you have not heard from us regarding the results in 2 weeks, please contact this office. °  ° ° ° °

## 2020-06-04 ENCOUNTER — Encounter: Payer: Self-pay | Admitting: *Deleted

## 2020-06-08 ENCOUNTER — Ambulatory Visit (INDEPENDENT_AMBULATORY_CARE_PROVIDER_SITE_OTHER): Payer: Managed Care, Other (non HMO) | Admitting: Registered Nurse

## 2020-06-08 ENCOUNTER — Encounter: Payer: Self-pay | Admitting: Registered Nurse

## 2020-06-08 ENCOUNTER — Other Ambulatory Visit: Payer: Self-pay

## 2020-06-08 VITALS — BP 117/78 | HR 70 | Temp 97.9°F | Resp 18 | Ht 67.0 in | Wt 240.4 lb

## 2020-06-08 DIAGNOSIS — H5710 Ocular pain, unspecified eye: Secondary | ICD-10-CM | POA: Diagnosis not present

## 2020-06-08 DIAGNOSIS — Q165 Congenital malformation of inner ear: Secondary | ICD-10-CM | POA: Diagnosis not present

## 2020-06-08 DIAGNOSIS — R42 Dizziness and giddiness: Secondary | ICD-10-CM | POA: Diagnosis not present

## 2020-06-08 MED ORDER — ERYTHROMYCIN 5 MG/GM OP OINT: 1.0000 "application " | TOPICAL_OINTMENT | Freq: Three times a day (TID) | OPHTHALMIC | 0 refills | Status: DC

## 2020-06-08 MED ORDER — MECLIZINE HCL 12.5 MG PO TABS: 12.5000 mg | ORAL_TABLET | Freq: Three times a day (TID) | ORAL | 0 refills | Status: DC | PRN

## 2020-06-08 NOTE — Patient Instructions (Signed)
° ° ° °  If you have lab work done today you will be contacted with your lab results within the next 2 weeks.  If you have not heard from us then please contact us. The fastest way to get your results is to register for My Chart. ° ° °IF you received an x-ray today, you will receive an invoice from [facility]. Please contact [facility] at [phone] with questions or concerns regarding your invoice.  ° °IF you received labwork today, you will receive an invoice from [facility]. Please contact [facility] at [phone] with questions or concerns regarding your invoice.  ° °Our billing staff will not be able to assist you with questions regarding bills from these companies. ° °You will be contacted with the lab results as soon as they are available. The fastest way to get your results is to activate your My Chart account. Instructions are located on the last page of this paperwork. If you have not heard from us regarding the results in 2 weeks, please contact this office. °  ° ° ° °

## 2020-06-08 NOTE — Progress Notes (Signed)
Acute Office Visit  Subjective:    Patient ID: Shane Gillespie, male    DOB: 01-21-1981, 39 y.o.   MRN: [id number]  Chief Complaint  Patient presents with  . Dizziness    patient states he was up walking this morning and out of nowhere he felt like he was going to faint. Per patient this has never happened before  . Eye Problem    Patient was using clear eyes for his eyes now it feels like something is in right eye    HPI Patient is in today for dizziness  Works third shift. At around 2am, noted sudden dizziness, felt the room was spinning around him. Was able to sit down and feel stable within 1-2 minutes. He had not had episodes like this before or since. Denies chest pain, shob, doe, palpitations, nvd, dependent edema, claudication, history of vertigo, poor PO intake, or other symptoms/factors. Feeling well now.  Also notes that he has scratched his eye - concerned for foreign body from work, he had been itching his eye a lot. No foreign body but feels swollen, tender.  No past medical history on file.  No past surgical history on file.  No family history on file.  Social History   Socioeconomic History  . Marital status: Married    Spouse name: Not on file  . Number of children: 3  . Years of education: Not on file  . Highest education level: Not on file  Occupational History  . Not on file  Tobacco Use  . Smoking status: Never Smoker  . Smokeless tobacco: Never Used  Vaping Use  . Vaping Use: Never used  Substance and Sexual Activity  . Alcohol use: No  . Drug use: No  . Sexual activity: Not Currently  Other Topics Concern  . Not on file  Social History Narrative  . Not on file   Social Determinants of Health   Financial Resource Strain: Low Risk   . Difficulty of Paying Living Expenses: Not hard at all  Food Insecurity: No Food Insecurity  . Worried About Charity fundraiser in the Last Year: Never true  . Ran Out of Food in the Last Year: Never true    Transportation Needs: No Transportation Needs  . Lack of Transportation (Medical): No  . Lack of Transportation (Non-Medical): No  Physical Activity: Insufficiently Active  . Days of Exercise per Week: 3 days  . Minutes of Exercise per Session: 30 min  Stress: Stress Concern Present  . Feeling of Stress : To some extent  Social Connections: Moderately Integrated  . Frequency of Communication with Friends and Family: Three times a week  . Frequency of Social Gatherings with Friends and Family: Twice a week  . Attends Religious Services: More than 4 times per year  . Active Member of Clubs or Organizations: No  . Attends Archivist Meetings: Never  . Marital Status: Married  Human resources officer Violence: Not At Risk  . Fear of Current or Ex-Partner: No  . Emotionally Abused: No  . Physically Abused: No  . Sexually Abused: No    Outpatient Medications Prior to Visit  Medication Sig Dispense Refill  . azithromycin (ZITHROMAX) 250 MG tablet Take 2 tabs on first day. Then take 1 daily. Finish entire supply. (Patient not taking: Reported on 06/08/2020) 6 tablet 0  . doxycycline (VIBRA-TABS) 100 MG tablet Take 1 tablet (100 mg total) by mouth 2 (two) times daily. For 10 days when boil presents (  Patient not taking: Reported on 05/19/2020) 20 tablet 2   No facility-administered medications prior to visit.    No Known Allergies  Review of Systems Per hpi      Objective:    Physical Exam Vitals and nursing note reviewed.  Constitutional:      General: He is not in acute distress.    Appearance: Normal appearance. He is obese. He is not ill-appearing, toxic-appearing or diaphoretic.  HENT:     Ears:     Comments: L ear has abnormality on TM. Unsure of etiology.    Nose: Nose normal. No congestion or rhinorrhea.     Mouth/Throat:     Mouth: Mucous membranes are moist.     Pharynx: Oropharynx is clear. No oropharyngeal exudate or posterior oropharyngeal erythema.  Eyes:      Extraocular Movements: Extraocular movements intact.     Pupils: Pupils are equal, round, and reactive to light.     Comments: Scratch on medial side of iris  Cardiovascular:     Rate and Rhythm: Normal rate and regular rhythm.     Pulses: Normal pulses.     Heart sounds: Normal heart sounds. No murmur heard.  No friction rub. No gallop.   Pulmonary:     Effort: Pulmonary effort is normal. No respiratory distress.     Breath sounds: Normal breath sounds. No stridor. No wheezing, rhonchi or rales.  Chest:     Chest wall: No tenderness.  Skin:    General: Skin is warm and dry.     Coloration: Skin is not jaundiced or pale.     Findings: No bruising, erythema, lesion or rash.  Neurological:     General: No focal deficit present.     Mental Status: He is alert and oriented to person, place, and time. Mental status is at baseline.     Cranial Nerves: No cranial nerve deficit.     Sensory: No sensory deficit.     Motor: No weakness.     Coordination: Coordination normal.     Gait: Gait normal.     Deep Tendon Reflexes: Reflexes normal.  Psychiatric:        Mood and Affect: Mood normal.        Behavior: Behavior normal.        Thought Content: Thought content normal.        Judgment: Judgment normal.     BP 117/78   Pulse 70   Temp 97.9 F (36.6 C) (Temporal)   Resp 18   Ht 5\' 7"  (1.702 m)   Wt 240 lb 6.4 oz (109 kg)   SpO2 97%   BMI 37.65 kg/m  Wt Readings from Last 3 Encounters:  06/08/20 240 lb 6.4 oz (109 kg)  07/09/19 224 lb (101.6 kg)  04/20/16 200 lb (90.7 kg)    Health Maintenance Due  Topic Date Due  . Hepatitis C Screening  Never done    There are no preventive care reminders to display for this patient.   Lab Results  Component Value Date   TSH 2.760 07/09/2019   Lab Results  Component Value Date   WBC 4.5 07/09/2019   HGB 13.5 07/09/2019   HCT 41.6 07/09/2019   MCV 83 07/09/2019   PLT 216 07/09/2019   Lab Results  Component Value Date   NA  140 07/09/2019   K 3.9 07/09/2019   CO2 22 07/09/2019   GLUCOSE 94 07/09/2019   BUN 10 07/09/2019   CREATININE 1.10  07/09/2019   BILITOT <0.2 07/09/2019   ALKPHOS 61 07/09/2019   AST 27 07/09/2019   ALT 28 07/09/2019   PROT 7.1 07/09/2019   ALBUMIN 4.1 07/09/2019   CALCIUM 8.9 07/09/2019   Lab Results  Component Value Date   CHOL 164 07/09/2019   Lab Results  Component Value Date   HDL 56 07/09/2019   Lab Results  Component Value Date   LDLCALC 98 07/09/2019   Lab Results  Component Value Date   TRIG 51 07/09/2019   Lab Results  Component Value Date   CHOLHDL 2.9 07/09/2019   No results found for: HGBA1C     Assessment & Plan:   Problem List Items Addressed This Visit    None    Visit Diagnoses    Dizziness    -  Primary   Relevant Medications   meclizine (ANTIVERT) 12.5 MG tablet   Other Relevant Orders   CBC with Differential   Platelet count   Thyroid Panel With TSH   Comprehensive metabolic panel   Inner ear anomaly       Relevant Orders   Ambulatory referral to ENT       Meds ordered this encounter  Medications  . meclizine (ANTIVERT) 12.5 MG tablet    Sig: Take 1 tablet (12.5 mg total) by mouth 3 (three) times daily as needed for dizziness.    Dispense:  30 tablet    Refill:  0    Order Specific Question:   Supervising Provider    Answer:   Neva Seat, JEFFREY R [[id number]]   PLAN  Unsure of etiology of dizzy spell - no cv history and low suspicion for cv event. Will trial meclizine  Given tm abnormality will refer to ENT - may be contributing to symptoms  Labs collected, will follow up as warranted  Patient encouraged to call clinic with any questions, comments, or concerns.  Janeece Agee, NP

## 2020-06-09 ENCOUNTER — Encounter: Payer: Self-pay | Admitting: Registered Nurse

## 2020-06-09 LAB — THYROID PANEL WITH TSH
Free Thyroxine Index: 1.5 (ref 1.2–4.9)
T3 Uptake Ratio: 35 % (ref 24–39)
T4, Total: 4.2 ug/dL — ABNORMAL LOW (ref 4.5–12.0)
TSH: 1.1 u[IU]/mL (ref 0.450–4.500)

## 2020-06-09 LAB — COMPREHENSIVE METABOLIC PANEL
ALT: 48 IU/L — ABNORMAL HIGH (ref 0–44)
AST: 31 IU/L (ref 0–40)
Albumin/Globulin Ratio: 1.4 (ref 1.2–2.2)
Albumin: 4.2 g/dL (ref 4.0–5.0)
Alkaline Phosphatase: 62 IU/L (ref 48–121)
BUN/Creatinine Ratio: 12 (ref 9–20)
BUN: 11 mg/dL (ref 6–20)
Bilirubin Total: <0.2 mg/dL (ref 0.0–1.2)
CO2: 24 mmol/L (ref 20–29)
Calcium: 9.4 mg/dL (ref 8.7–10.2)
Chloride: 104 mmol/L (ref 96–106)
Creatinine, Ser: 0.95 mg/dL (ref 0.76–1.27)
GFR calc Af Amer: 117 mL/min/{1.73_m2} (ref >59)
GFR calc non Af Amer: 101 mL/min/{1.73_m2} (ref >59)
Globulin, Total: 2.9 g/dL (ref 1.5–4.5)
Glucose: 91 mg/dL (ref 65–99)
Potassium: 4.7 mmol/L (ref 3.5–5.2)
Sodium: 139 mmol/L (ref 134–144)
Total Protein: 7.1 g/dL (ref 6.0–8.5)

## 2020-06-09 LAB — CBC WITH DIFFERENTIAL/PLATELET
Basophils Absolute: 0 10*3/uL (ref 0.0–0.2)
Basos: 1 %
EOS (ABSOLUTE): 0.2 10*3/uL (ref 0.0–0.4)
Eos: 4 %
Hematocrit: 45.5 % (ref 37.5–51.0)
Hemoglobin: 14.4 g/dL (ref 13.0–17.7)
Immature Grans (Abs): 0 10*3/uL (ref 0.0–0.1)
Immature Granulocytes: 0 %
Lymphocytes Absolute: 2 10*3/uL (ref 0.7–3.1)
Lymphs: 45 %
MCH: 27.1 pg (ref 26.6–33.0)
MCHC: 31.6 g/dL (ref 31.5–35.7)
MCV: 86 fL (ref 79–97)
Monocytes Absolute: 0.5 10*3/uL (ref 0.1–0.9)
Monocytes: 12 %
Neutrophils Absolute: 1.7 10*3/uL (ref 1.4–7.0)
Neutrophils: 38 %
Platelets: 198 10*3/uL (ref 150–450)
RBC: 5.31 x10E6/uL (ref 4.14–5.80)
RDW: 13.7 % (ref 11.6–15.4)
WBC: 4.4 10*3/uL (ref 3.4–10.8)

## 2020-06-10 ENCOUNTER — Encounter: Payer: Self-pay | Admitting: Registered Nurse

## 2020-06-11 ENCOUNTER — Encounter: Payer: Self-pay | Admitting: Registered Nurse

## 2020-06-12 ENCOUNTER — Telehealth: Payer: Self-pay | Admitting: Registered Nurse

## 2020-06-12 NOTE — Telephone Encounter (Signed)
Copied from CRM [id number]. Topic: Referral - Status >> Jun 11, 2020  3:43 PM Marylen Ponto wrote: Reason for CRM: Revonda Standard with Dr. Avel Sensor office stated the referral needs to be sent to another provider due to conflict in scheduling. Revonda Standard stated patient is only available for morning appts but Dr. Suszanne Conners only has afternoon appt as he does surgeries in the mornings. Cb# [phone]

## 2020-06-25 ENCOUNTER — Other Ambulatory Visit: Payer: Self-pay

## 2020-06-25 ENCOUNTER — Encounter: Payer: Self-pay | Admitting: Family Medicine

## 2020-06-25 ENCOUNTER — Ambulatory Visit (INDEPENDENT_AMBULATORY_CARE_PROVIDER_SITE_OTHER): Payer: Managed Care, Other (non HMO) | Admitting: Family Medicine

## 2020-06-25 VITALS — BP 122/79 | HR 69 | Temp 98.0°F | Ht 67.0 in | Wt 238.0 lb

## 2020-06-25 DIAGNOSIS — R079 Chest pain, unspecified: Secondary | ICD-10-CM | POA: Diagnosis not present

## 2020-06-25 DIAGNOSIS — K219 Gastro-esophageal reflux disease without esophagitis: Secondary | ICD-10-CM

## 2020-06-25 MED ORDER — PANTOPRAZOLE SODIUM 40 MG PO TBEC: 40.0000 mg | DELAYED_RELEASE_TABLET | Freq: Every day | ORAL | 2 refills | Status: DC

## 2020-06-25 NOTE — Patient Instructions (Addendum)
   Take pantoprazole 1 daily to reduce stomach acid.  Avoid any food that seems to irritate your stomach  If you ever have severe chest pain, especially associated with lightheadedness or shortness of breath or vomiting or breaking out in a sweat you should go to the emergency room for further testing.  If you keep having these current pains, we need to refer you onto a cardiologist for further evaluation so come back or contact us.   If you have lab work done today you will be contacted with your lab results within the next 2 weeks.  If you have not heard from [place] then please contact us. The fastest way to get your results is to register for My Chart.   IF you received an x-ray today, you will receive an invoice from [facility]. Please contact [facility] at [phone] with questions or concerns regarding your invoice.   IF you received labwork today, you will receive an invoice from [facility]. Please contact [facility] at [phone] with questions or concerns regarding your invoice.   Our billing staff will not be able to assist you with questions regarding bills from these companies.  You will be contacted with the lab results as soon as they are available. The fastest way to get your results is to activate your My Chart account. Instructions are located on the last page of this paperwork. If you have not heard from [place] regarding the results in 2 weeks, please contact this office.

## 2020-06-25 NOTE — Progress Notes (Signed)
Patient ID: Shane Gillespie, male    DOB: 04-Oct-1981  Age: 39 y.o. MRN: [id number]  Chief Complaint  Patient presents with  . 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 He does not drink. He said he got home and started having pain in the chest and abdomen. He was nauseous. Did not vomit. He kept having discomfort the last couple of days and feels fine today. 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  No other complaints this time. No dizziness or shortness of breath or radiation of pain.  Current allergies, medications, problem list, past/family and social histories reviewed.  Objective:  BP 122/79   Pulse 69   Temp 98 F (36.7 C)   Ht 5\' 7"  (1.702 m)   Wt 238 lb (108 kg)   SpO2 98%   BMI 37.28 kg/m   No acute distress. Healthy and alert appearing. Throat clear. Neck supple without nodes or thyromegaly. Chest clear. Heart regular without murmurs gallops or arrhythmias. Abdomen soft that mass or tenderness. Assessment & Plan:   Assessment: 1. Chest pain, unspecified type   2. Gastroesophageal reflux disease without esophagitis       Plan: Does not seem like cardiac chest pain at all. EKG looks fine. Orders Placed This Encounter  Procedures  . EKG 12-Lead    Meds ordered this encounter  Medications  . pantoprazole (PROTONIX) 40 MG tablet    Sig: Take 1 tablet (40 mg total) by mouth daily.    Dispense:  30 tablet    Refill:  2         Patient Instructions     Take pantoprazole 1 daily to reduce stomach acid.  Avoid any food that seems to irritate your stomach  If you ever have severe chest pain, especially associated with lightheadedness or shortness of breath or vomiting or  breaking out in a sweat you should go to the emergency room for further testing.  If you keep having these current pains, we need to refer you onto a cardiologist for further evaluation so come back or contact .   If you have lab work done today you will be contacted with your lab results within the next 2 weeks.  If you have not heard from [place] then please contact us. The fastest way to get your results is to register for My Chart.   IF you received an x-ray today, you will receive an invoice from [facility]. Please contact [facility] at [phone] with questions or concerns regarding your invoice.   IF you received labwork today, you will receive an invoice from [facility]. Please contact [facility] at [phone] with questions or concerns regarding your invoice.   Our billing staff will not be able to assist you with questions regarding bills from these companies.  You will be contacted with the lab results as soon as they are available. The fastest way to get your results is to activate your My Chart account. Instructions are located on the last page of this paperwork. If you have not heard from [phone] regarding the results in 2 weeks, please contact this office.         Return if symptoms worsen  or fail to improve.   Janace Hoard, MD 06/25/2020

## 2020-09-07 ENCOUNTER — Encounter: Payer: Self-pay | Admitting: Registered Nurse

## 2020-09-07 ENCOUNTER — Other Ambulatory Visit: Payer: Self-pay

## 2020-09-07 ENCOUNTER — Ambulatory Visit (INDEPENDENT_AMBULATORY_CARE_PROVIDER_SITE_OTHER): Payer: Managed Care, Other (non HMO) | Admitting: Registered Nurse

## 2020-09-07 VITALS — BP 114/72 | HR 74 | Temp 97.9°F | Resp 15 | Ht 67.0 in | Wt 237.4 lb

## 2020-09-07 DIAGNOSIS — R519 Headache, unspecified: Secondary | ICD-10-CM

## 2020-09-07 DIAGNOSIS — G8929 Other chronic pain: Secondary | ICD-10-CM

## 2020-09-07 MED ORDER — SUMATRIPTAN SUCCINATE 50 MG PO TABS: 50.0000 mg | ORAL_TABLET | ORAL | 0 refills | Status: DC | PRN

## 2020-09-07 NOTE — Patient Instructions (Signed)
° ° ° °  If you have lab work done today you will be contacted with your lab results within the next 2 weeks.  If you have not heard from us then please contact us. The fastest way to get your results is to register for My Chart. ° ° °IF you received an x-ray today, you will receive an invoice from [facility]. Please contact [facility] at [phone] with questions or concerns regarding your invoice.  ° °IF you received labwork today, you will receive an invoice from [facility]. Please contact [facility] at [phone] with questions or concerns regarding your invoice.  ° °Our billing staff will not be able to assist you with questions regarding bills from these companies. ° °You will be contacted with the lab results as soon as they are available. The fastest way to get your results is to activate your My Chart account. Instructions are located on the last page of this paperwork. If you have not heard from us regarding the results in 2 weeks, please contact this office. °  ° ° ° °

## 2020-09-22 ENCOUNTER — Other Ambulatory Visit: Payer: Self-pay

## 2020-09-22 ENCOUNTER — Ambulatory Visit (INDEPENDENT_AMBULATORY_CARE_PROVIDER_SITE_OTHER): Payer: Managed Care, Other (non HMO) | Admitting: Registered Nurse

## 2020-09-22 ENCOUNTER — Encounter: Payer: Self-pay | Admitting: Registered Nurse

## 2020-09-22 VITALS — BP 115/76 | HR 67 | Temp 98.0°F | Ht 67.5 in | Wt 236.0 lb

## 2020-09-22 DIAGNOSIS — Z13228 Encounter for screening for other metabolic disorders: Secondary | ICD-10-CM

## 2020-09-22 DIAGNOSIS — Z0001 Encounter for general adult medical examination with abnormal findings: Secondary | ICD-10-CM

## 2020-09-22 DIAGNOSIS — Z130 Encounter for screening for diseases of the blood and blood-forming organs and certain disorders involving the immune mechanism: Secondary | ICD-10-CM

## 2020-09-22 DIAGNOSIS — Q165 Congenital malformation of inner ear: Secondary | ICD-10-CM | POA: Diagnosis not present

## 2020-09-22 DIAGNOSIS — Z1329 Encounter for screening for other suspected endocrine disorder: Secondary | ICD-10-CM

## 2020-09-22 DIAGNOSIS — Z0000 Encounter for general adult medical examination without abnormal findings: Secondary | ICD-10-CM

## 2020-09-22 NOTE — Patient Instructions (Signed)
° ° ° °  If you have lab work done today you will be contacted with your lab results within the next 2 weeks.  If you have not heard from us then please contact us. The fastest way to get your results is to register for My Chart. ° ° °IF you received an x-ray today, you will receive an invoice from [facility]. Please contact [facility] at [phone] with questions or concerns regarding your invoice.  ° °IF you received labwork today, you will receive an invoice from [facility]. Please contact [facility] at [phone] with questions or concerns regarding your invoice.  ° °Our billing staff will not be able to assist you with questions regarding bills from these companies. ° °You will be contacted with the lab results as soon as they are available. The fastest way to get your results is to activate your My Chart account. Instructions are located on the last page of this paperwork. If you have not heard from us regarding the results in 2 weeks, please contact this office. °  ° ° ° °

## 2020-09-22 NOTE — Progress Notes (Signed)
Established Patient Office Visit  Subjective:  Patient ID: Shane Gillespie, male    DOB: 09/10/1981  Age: 39 y.o. MRN: [id number]  CC:  Chief Complaint  Patient presents with   Annual Exam    No complaints    HPI Shane Gillespie presents for CPE  No complaints  Notes that sumatriptan has worked well for headache, will continue to use on PRN basis  History reviewed. No pertinent past medical history.  History reviewed. No pertinent surgical history.  History reviewed. No pertinent family history.  Social History   Socioeconomic History   Marital status: Married    Spouse name: Not on file   Number of children: 3   Years of education: Not on file   Highest education level: Not on file  Occupational History   Not on file  Tobacco Use   Smoking status: Never Smoker   Smokeless tobacco: Never Used  Vaping Use   Vaping Use: Never used  Substance and Sexual Activity   Alcohol use: No   Drug use: No   Sexual activity: Not Currently  Other Topics Concern   Not on file  Social History Narrative   Not on file   Social Determinants of Health   Financial Resource Strain:    Difficulty of Paying Living Expenses: Not on file  Food Insecurity:    Worried About [place] in the Last Year: Not on file   Ran Out of Food in the Last Year: Not on file  Transportation Needs:    Lack of Transportation (Medical): Not on file   Lack of Transportation (Non-Medical): Not on file  Physical Activity:    Days of Exercise per Week: Not on file   Minutes of Exercise per Session: Not on file  Stress:    Feeling of Stress : Not on file  Social Connections:    Frequency of Communication with Friends and Family: Not on file   Frequency of Social Gatherings with Friends and Family: Not on file   Attends Religious Services: Not on file   Active Member of Clubs or Organizations: Not on file   Attends Archivist Meetings: Not on file    Marital Status: Not on file  Intimate Partner Violence:    Fear of Current or Ex-Partner: Not on file   Emotionally Abused: Not on file   Physically Abused: Not on file   Sexually Abused: Not on file    Outpatient Medications Prior to Visit  Medication Sig Dispense Refill   SUMAtriptan (IMITREX) 50 MG tablet Take 1 tablet (50 mg total) by mouth every 2 (two) hours as needed for migraine. May repeat in 2 hours if headache persists or recurs. 10 tablet 0   No facility-administered medications prior to visit.    No Known Allergies  ROS Review of Systems  Constitutional: Negative.   HENT: Negative.   Eyes: Negative.   Respiratory: Negative.   Cardiovascular: Negative.   Gastrointestinal: Negative.   Genitourinary: Negative.   Musculoskeletal: Negative.   Skin: Negative.   Neurological: Negative.   Psychiatric/Behavioral: Negative.       Objective:    Physical Exam Vitals and nursing note reviewed.  Constitutional:      General: He is not in acute distress.    Appearance: Normal appearance. He is normal weight. He is not ill-appearing, toxic-appearing or diaphoretic.  HENT:     Head: Normocephalic and atraumatic.     Right Ear: Ear canal and external ear normal.  Decreased hearing noted. There is no impacted cerumen. Tympanic membrane is scarred and bulging.     Left Ear: Ear canal and external ear normal. Decreased hearing noted. There is no impacted cerumen. Tympanic membrane is scarred, erythematous and bulging.     Nose: Nose normal. No congestion or rhinorrhea.     Mouth/Throat:     Mouth: Mucous membranes are moist.     Pharynx: Oropharynx is clear. No oropharyngeal exudate or posterior oropharyngeal erythema.  Eyes:     General: No scleral icterus.       Right eye: No discharge.        Left eye: No discharge.     Extraocular Movements: Extraocular movements intact.     Conjunctiva/sclera: Conjunctivae normal.     Pupils: Pupils are equal, round, and  reactive to light.  Neck:     Vascular: No carotid bruit.  Cardiovascular:     Rate and Rhythm: Normal rate and regular rhythm.     Pulses: Normal pulses.     Heart sounds: Normal heart sounds. No murmur heard.  No friction rub. No gallop.   Pulmonary:     Effort: Pulmonary effort is normal. No respiratory distress.     Breath sounds: Normal breath sounds. No stridor. No wheezing, rhonchi or rales.  Chest:     Chest wall: No tenderness.  Abdominal:     General: Abdomen is flat. Bowel sounds are normal. There is no distension.     Palpations: Abdomen is soft. There is no mass.     Tenderness: There is no abdominal tenderness. There is no right CVA tenderness, left CVA tenderness, guarding or rebound.     Hernia: No hernia is present.  Musculoskeletal:        General: No swelling, tenderness, deformity or signs of injury. Normal range of motion.     Cervical back: Normal range of motion and neck supple. No rigidity or tenderness.     Right lower leg: No edema.     Left lower leg: No edema.  Lymphadenopathy:     Cervical: No cervical adenopathy.  Skin:    General: Skin is warm and dry.     Capillary Refill: Capillary refill takes less than 2 seconds.     Coloration: Skin is not jaundiced or pale.     Findings: No bruising, erythema, lesion or rash.  Neurological:     General: No focal deficit present.     Mental Status: He is alert and oriented to person, place, and time. Mental status is at baseline.     Cranial Nerves: No cranial nerve deficit.     Motor: No weakness.     Gait: Gait normal.  Psychiatric:        Mood and Affect: Mood normal.        Behavior: Behavior normal.        Thought Content: Thought content normal.        Judgment: Judgment normal.     BP 115/76 (BP Location: Right Arm, Patient Position: Sitting, Cuff Size: Large)    Pulse 67    Temp 98 F (36.7 C) (Temporal)    Ht 5' 7.5" (1.715 m)    Wt 236 lb (107 kg)    SpO2 99%    BMI 36.42 kg/m  Wt Readings  from Last 3 Encounters:  09/22/20 236 lb (107 kg)  09/07/20 237 lb 6.4 oz (107.7 kg)  06/25/20 238 lb (108 kg)     There are no  preventive care reminders to display for this patient.  There are no preventive care reminders to display for this patient.  Lab Results  Component Value Date   TSH 1.100 06/08/2020   Lab Results  Component Value Date   WBC 4.4 06/08/2020   HGB 14.4 06/08/2020   HCT 45.5 06/08/2020   MCV 86 06/08/2020   PLT 198 06/08/2020   Lab Results  Component Value Date   NA 139 06/08/2020   K 4.7 06/08/2020   CO2 24 06/08/2020   GLUCOSE 91 06/08/2020   BUN 11 06/08/2020   CREATININE 0.95 06/08/2020   BILITOT <0.2 06/08/2020   ALKPHOS 62 06/08/2020   AST 31 06/08/2020   ALT 48 (H) 06/08/2020   PROT 7.1 06/08/2020   ALBUMIN 4.2 06/08/2020   CALCIUM 9.4 06/08/2020   Lab Results  Component Value Date   CHOL 164 07/09/2019   Lab Results  Component Value Date   HDL 56 07/09/2019   Lab Results  Component Value Date   LDLCALC 98 07/09/2019   Lab Results  Component Value Date   TRIG 51 07/09/2019   Lab Results  Component Value Date   CHOLHDL 2.9 07/09/2019   No results found for: HGBA1C    Assessment & Plan:   Problem List Items Addressed This Visit    None    Visit Diagnoses    Annual physical exam    -  Primary   Screening for endocrine, metabolic and immunity disorder       Relevant Orders   CMP14+EGFR   Thyroid Panel With TSH      No orders of the defined types were placed in this encounter.   Follow-up: No follow-ups on file.   PLAN  CPE notable for ongoing findings of TM abnormalities. Scarring, bulging, thickening apparent. Does appear like there is an ulceration on L TM. Upon further questioning, patient does admit that hearing is diminished at times, but does not seem to be chronic. Was referred to ENT, unfortunately was unable to make an appt.  Labs drawn to follow up on abnormalities noted in June.  Otherwise CPE  unremarkable  Return in 1 year for CPE and labs  Patient encouraged to call clinic with any questions, comments, or concerns.  Maximiano Coss, NP

## 2020-09-23 LAB — CMP14+EGFR
ALT: 34 IU/L (ref 0–44)
AST: 26 IU/L (ref 0–40)
Albumin/Globulin Ratio: 1.3 (ref 1.2–2.2)
Albumin: 4.3 g/dL (ref 4.0–5.0)
Alkaline Phosphatase: 58 IU/L (ref 44–121)
BUN/Creatinine Ratio: 7 — ABNORMAL LOW (ref 9–20)
BUN: 8 mg/dL (ref 6–20)
Bilirubin Total: 0.3 mg/dL (ref 0.0–1.2)
CO2: 22 mmol/L (ref 20–29)
Calcium: 9.2 mg/dL (ref 8.7–10.2)
Chloride: 103 mmol/L (ref 96–106)
Creatinine, Ser: 1.07 mg/dL (ref 0.76–1.27)
GFR calc Af Amer: 101 mL/min/{1.73_m2} (ref >59)
GFR calc non Af Amer: 88 mL/min/{1.73_m2} (ref >59)
Globulin, Total: 3.3 g/dL (ref 1.5–4.5)
Glucose: 95 mg/dL (ref 65–99)
Potassium: 3.8 mmol/L (ref 3.5–5.2)
Sodium: 137 mmol/L (ref 134–144)
Total Protein: 7.6 g/dL (ref 6.0–8.5)

## 2020-09-23 LAB — THYROID PANEL WITH TSH
Free Thyroxine Index: 1.4 (ref 1.2–4.9)
T3 Uptake Ratio: 35 % (ref 24–39)
T4, Total: 4.1 ug/dL — ABNORMAL LOW (ref 4.5–12.0)
TSH: 1.58 u[IU]/mL (ref 0.450–4.500)

## 2020-10-01 ENCOUNTER — Encounter (INDEPENDENT_AMBULATORY_CARE_PROVIDER_SITE_OTHER): Payer: Self-pay | Admitting: Otolaryngology

## 2020-10-01 ENCOUNTER — Other Ambulatory Visit: Payer: Self-pay

## 2020-10-01 ENCOUNTER — Ambulatory Visit (INDEPENDENT_AMBULATORY_CARE_PROVIDER_SITE_OTHER): Payer: Managed Care, Other (non HMO) | Admitting: Otolaryngology

## 2020-10-01 VITALS — Temp 97.5°F

## 2020-10-01 DIAGNOSIS — J310 Chronic rhinitis: Secondary | ICD-10-CM | POA: Diagnosis not present

## 2020-10-01 DIAGNOSIS — H6983 Other specified disorders of Eustachian tube, bilateral: Secondary | ICD-10-CM | POA: Diagnosis not present

## 2020-10-01 NOTE — Progress Notes (Signed)
HPI: Shane Gillespie is a 39 y.o. male who presents is referred by Janeece Agee, NP for evaluation of TM abnormalities noted on recent examination.  Shane Gillespie has not noted any hearing problems.  He has had occasional discomfort in the left ear.  He is referred here because of abnormality noted by his PCP on recent examination..  No past medical history on file. No past surgical history on file. Social History   Socioeconomic History   Marital status: Married    Spouse name: Not on file   Number of children: 3   Years of education: Not on file   Highest education level: Not on file  Occupational History   Not on file  Tobacco Use   Smoking status: Never Smoker   Smokeless tobacco: Never Used  Vaping Use   Vaping Use: Never used  Substance and Sexual Activity   Alcohol use: No   Drug use: No   Sexual activity: Not Currently  Other Topics Concern   Not on file  Social History Narrative   Not on file   Social Determinants of Health   Financial Resource Strain:    Difficulty of Paying Living Expenses: Not on file  Food Insecurity:    Worried About Running Out of Food in the Last Year: Not on file   Ran Out of Food in the Last Year: Not on file  Transportation Needs:    Lack of Transportation (Medical): Not on file   Lack of Transportation (Non-Medical): Not on file  Physical Activity:    Days of Exercise per Week: Not on file   Minutes of Exercise per Session: Not on file  Stress:    Feeling of Stress : Not on file  Social Connections:    Frequency of Communication with Friends and Family: Not on file   Frequency of Social Gatherings with Friends and Family: Not on file   Attends Religious Services: Not on file   Active Member of Clubs or Organizations: Not on file   Attends Banker Meetings: Not on file   Marital Status: Not on file   No family history on file. No Known Allergies Prior to Admission medications   Medication Sig  Start Date End Date Taking? Authorizing Provider  SUMAtriptan (IMITREX) 50 MG tablet Take 1 tablet (50 mg total) by mouth every 2 (two) hours as needed for migraine. May repeat in 2 hours if headache persists or recurs. 09/07/20  Yes Janeece Agee, NP     Positive ROS: Otherwise negative  All other systems have been reviewed and were otherwise negative with the exception of those mentioned in the HPI and as above.  Physical Exam: Constitutional: Alert, well-appearing, no acute distress Ears: External ears without lesions or tenderness. Ear canals are clear bilaterally.  On microscopic exam right TM is slightly retracted but patient is able to insufflate air behind the right TM without difficulty.  The left TM is retracted but he also has some debris stuck within the retraction pocket anteriorly on the left side.  This was removed with forceps.  The TM is otherwise intact and clear.  Hearing screening with a tuning forks revealed symmetric hearing in both ears with AC > BC bilaterally. Nasal: External nose without lesions. Septum slightly deviated to the right with moderate rhinitis..  Patient has a small polyp arising from the middle turbinate on the left side.  He does not complain of any trouble breathing however. Oral: Lips and gums without lesions. Tongue and palate  mucosa without lesions. Posterior oropharynx clear. Neck: No palpable adenopathy or masses Respiratory: Breathing comfortably  Skin: No facial/neck lesions or rash noted.  Procedures  Assessment: Chronic rhinitis with mild eustachian tube dysfunction.  Both TMs are slightly retracted but patient is able to insufflate air behind both TMs without difficulty.  The debris on the left side was stuck within a small retraction pocket anteriorly on the left TM.  Plan: He is having no trouble breathing presently and no problem with his hearing.  Clinically has some eustachian tube dysfunction and rhinitis and might benefit from use of  nasal steroid spray if he has much nasal congestion.  But presently is not having difficulty with his breathing. He will follow-up as needed if he has any trouble with his breathing or difficulty with his ears or hearing.   Narda Bonds, MD   CC:

## 2020-11-21 ENCOUNTER — Encounter: Payer: Self-pay | Admitting: Registered Nurse

## 2020-11-21 NOTE — Progress Notes (Signed)
Established Patient Office Visit  Subjective:  Patient ID: Shane Gillespie, male    DOB: Jun 04, 1981  Age: 39 y.o. MRN: [id number]  CC:  Chief Complaint  Patient presents with  . Migraine    pt gets migraines, at random, pt notes theyre getting more frequent, pt notes no light or sound sensitivity, pt noted more frequent starting 1-2 months ago    HPI Charleton Hollick presents for migraines Has had them throughout the past but usually not problematic Now getting more frequent and severe Has taken OTCs with limited relief Having typical unilateral head pain but no photophobia or sensitivity to sound No other changes that he thinks could have led to this No further neuro symptoms Has not taken rx for migraine in the past   History reviewed. No pertinent past medical history.  History reviewed. No pertinent surgical history.  History reviewed. No pertinent family history.  Social History   Socioeconomic History  . Marital status: Married    Spouse name: Not on file  . Number of children: 3  . Years of education: Not on file  . Highest education level: Not on file  Occupational History  . Not on file  Tobacco Use  . Smoking status: Never Smoker  . Smokeless tobacco: Never Used  Vaping Use  . Vaping Use: Never used  Substance and Sexual Activity  . Alcohol use: No  . Drug use: No  . Sexual activity: Not Currently  Other Topics Concern  . Not on file  Social History Narrative  . Not on file   Social Determinants of Health   Financial Resource Strain:   . Difficulty of Paying Living Expenses: Not on file  Food Insecurity:   . Worried About Programme researcher, broadcasting/film/video in the Last Year: Not on file  . Ran Out of Food in the Last Year: Not on file  Transportation Needs:   . Lack of Transportation (Medical): Not on file  . Lack of Transportation (Non-Medical): Not on file  Physical Activity:   . Days of Exercise per Week: Not on file  . Minutes of Exercise per Session: Not on  file  Stress:   . Feeling of Stress : Not on file  Social Connections:   . Frequency of Communication with Friends and Family: Not on file  . Frequency of Social Gatherings with Friends and Family: Not on file  . Attends Religious Services: Not on file  . Active Member of Clubs or Organizations: Not on file  . Attends Banker Meetings: Not on file  . Marital Status: Not on file  Intimate Partner Violence:   . Fear of Current or Ex-Partner: Not on file  . Emotionally Abused: Not on file  . Physically Abused: Not on file  . Sexually Abused: Not on file    Outpatient Medications Prior to Visit  Medication Sig Dispense Refill  . pantoprazole (PROTONIX) 40 MG tablet Take 1 tablet (40 mg total) by mouth daily. (Patient not taking: Reported on 09/07/2020) 30 tablet 2   No facility-administered medications prior to visit.    No Known Allergies  ROS Review of Systems  Constitutional: Negative.   HENT: Negative.   Eyes: Negative.   Respiratory: Negative.   Cardiovascular: Negative.   Gastrointestinal: Negative.   Genitourinary: Negative.   Musculoskeletal: Negative.   Skin: Negative.   Neurological: Positive for headaches.  Psychiatric/Behavioral: Negative.       Objective:    Physical Exam Constitutional:  General: He is not in acute distress.    Appearance: Normal appearance. He is normal weight. He is not ill-appearing, toxic-appearing or diaphoretic.  Cardiovascular:     Rate and Rhythm: Normal rate and regular rhythm.     Heart sounds: Normal heart sounds. No murmur heard.  No friction rub. No gallop.   Pulmonary:     Effort: Pulmonary effort is normal. No respiratory distress.     Breath sounds: Normal breath sounds. No stridor. No wheezing, rhonchi or rales.  Chest:     Chest wall: No tenderness.  Neurological:     General: No focal deficit present.     Mental Status: He is alert and oriented to person, place, and time. Mental status is at  baseline.     Cranial Nerves: No cranial nerve deficit.     Sensory: No sensory deficit.     Motor: No weakness.     Coordination: Coordination normal.     Gait: Gait normal.  Psychiatric:        Mood and Affect: Mood normal.        Behavior: Behavior normal.        Thought Content: Thought content normal.        Judgment: Judgment normal.     BP 114/72   Pulse 74   Temp 97.9 F (36.6 C) (Temporal)   Resp 15   Ht 5\' 7"  (1.702 m)   Wt 237 lb 6.4 oz (107.7 kg)   SpO2 99%   BMI 37.18 kg/m  Wt Readings from Last 3 Encounters:  09/22/20 236 lb (107 kg)  09/07/20 237 lb 6.4 oz (107.7 kg)  06/25/20 238 lb (108 kg)     There are no preventive care reminders to display for this patient.  There are no preventive care reminders to display for this patient.  Lab Results  Component Value Date   TSH 1.580 09/22/2020   Lab Results  Component Value Date   WBC 4.4 06/08/2020   HGB 14.4 06/08/2020   HCT 45.5 06/08/2020   MCV 86 06/08/2020   PLT 198 06/08/2020   Lab Results  Component Value Date   NA 137 09/22/2020   K 3.8 09/22/2020   CO2 22 09/22/2020   GLUCOSE 95 09/22/2020   BUN 8 09/22/2020   CREATININE 1.07 09/22/2020   BILITOT 0.3 09/22/2020   ALKPHOS 58 09/22/2020   AST 26 09/22/2020   ALT 34 09/22/2020   PROT 7.6 09/22/2020   ALBUMIN 4.3 09/22/2020   CALCIUM 9.2 09/22/2020   Lab Results  Component Value Date   CHOL 164 07/09/2019   Lab Results  Component Value Date   HDL 56 07/09/2019   Lab Results  Component Value Date   LDLCALC 98 07/09/2019   Lab Results  Component Value Date   TRIG 51 07/09/2019   Lab Results  Component Value Date   CHOLHDL 2.9 07/09/2019   No results found for: HGBA1C    Assessment & Plan:   Problem List Items Addressed This Visit    None    Visit Diagnoses    Chronic nonintractable headache, unspecified headache type    -  Primary   Relevant Medications   SUMAtriptan (IMITREX) 50 MG tablet      Meds ordered  this encounter  Medications  . SUMAtriptan (IMITREX) 50 MG tablet    Sig: Take 1 tablet (50 mg total) by mouth every 2 (two) hours as needed for migraine. May repeat in 2 hours if  headache persists or recurs.    Dispense:  10 tablet    Refill:  0    Order Specific Question:   Supervising Provider    Answer:   Neva Seat, JEFFREY R [[id number]]    Follow-up: Return in about 2 weeks (around 09/21/2020) for CPE and labs - can take any 40 min slot - ok to book even if already CPE at that time.Marland Kitchen   PLAN  Patient unfortunately under the impression we were doing CPE today - will defer to future visit.   Sumatriptan for migraine - discusses sig with patient who demonstrates understanding  Discussed nonpharm for migraine relief  If not effective will consider referral to neuro  Patient encouraged to call clinic with any questions, comments, or concerns.   Janeece Agee, NP

## 2020-12-17 ENCOUNTER — Ambulatory Visit: Payer: Managed Care, Other (non HMO)

## 2020-12-24 ENCOUNTER — Ambulatory Visit: Payer: Managed Care, Other (non HMO) | Attending: Internal Medicine

## 2020-12-24 DIAGNOSIS — Z23 Encounter for immunization: Secondary | ICD-10-CM

## 2020-12-24 NOTE — Progress Notes (Signed)
   Covid-19 Vaccination Clinic  Name:  Shane Gillespie    MRN: [id number] DOB: 07-Aug-1981  12/24/2020  Shane Gillespie was observed post Covid-19 immunization for 15 minutes without incident. He was provided with Vaccine Information Sheet and instruction to access the V-Safe system.   Shane Gillespie was instructed to call 911 with any severe reactions post vaccine: Marland Kitchen Difficulty breathing  . Swelling of face and throat  . A fast heartbeat  . A bad rash all over body  . Dizziness and weakness   Immunizations Administered    Name Date Dose VIS Date Route   Pfizer COVID-19 Vaccine 12/24/2020  2:49 PM 0.3 mL 10/14/2020 Intramuscular   Manufacturer: [employer], [employer]   Lot: [id number]   NDC: [address]-2

## 2021-02-07 ENCOUNTER — Other Ambulatory Visit: Payer: Self-pay | Admitting: Registered Nurse

## 2021-02-07 ENCOUNTER — Encounter: Payer: Self-pay | Admitting: Registered Nurse

## 2021-02-07 DIAGNOSIS — G8929 Other chronic pain: Secondary | ICD-10-CM

## 2021-02-08 MED ORDER — SUMATRIPTAN SUCCINATE 50 MG PO TABS: 50.0000 mg | ORAL_TABLET | ORAL | 0 refills | Status: DC | PRN

## 2021-02-09 ENCOUNTER — Other Ambulatory Visit: Payer: Self-pay | Admitting: Registered Nurse

## 2021-02-09 DIAGNOSIS — L0293 Carbuncle, unspecified: Secondary | ICD-10-CM

## 2021-02-09 MED ORDER — DOXYCYCLINE HYCLATE 100 MG PO TABS
100.0000 mg | ORAL_TABLET | Freq: Two times a day (BID) | ORAL | 2 refills | Status: DC
Start: 2021-02-09 — End: 2022-07-27

## 2021-02-12 ENCOUNTER — Other Ambulatory Visit: Payer: Managed Care, Other (non HMO)

## 2021-02-12 DIAGNOSIS — Z20822 Contact with and (suspected) exposure to covid-19: Secondary | ICD-10-CM

## 2021-02-13 LAB — NOVEL CORONAVIRUS, NAA: SARS-CoV-2, NAA: NOT DETECTED

## 2021-02-13 LAB — SARS-COV-2, NAA 2 DAY TAT

## 2021-02-15 ENCOUNTER — Other Ambulatory Visit: Payer: Managed Care, Other (non HMO)

## 2021-02-15 DIAGNOSIS — Z20822 Contact with and (suspected) exposure to covid-19: Secondary | ICD-10-CM

## 2021-02-16 LAB — SARS-COV-2, NAA 2 DAY TAT

## 2021-02-16 LAB — NOVEL CORONAVIRUS, NAA: SARS-CoV-2, NAA: NOT DETECTED

## 2021-03-31 ENCOUNTER — Ambulatory Visit (HOSPITAL_COMMUNITY)
Admission: EM | Admit: 2021-03-31 | Discharge: 2021-03-31 | Disposition: A | Discharge status: Home / Self Care | Payer: 59 | Attending: Emergency Medicine | Admitting: Emergency Medicine

## 2021-03-31 ENCOUNTER — Other Ambulatory Visit: Payer: Self-pay

## 2021-03-31 ENCOUNTER — Encounter (HOSPITAL_COMMUNITY): Payer: Self-pay

## 2021-03-31 DIAGNOSIS — N4889 Other specified disorders of penis: Secondary | ICD-10-CM | POA: Insufficient documentation

## 2021-03-31 DIAGNOSIS — Z113 Encounter for screening for infections with a predominantly sexual mode of transmission: Secondary | ICD-10-CM | POA: Insufficient documentation

## 2021-03-31 DIAGNOSIS — N489 Disorder of penis, unspecified: Secondary | ICD-10-CM | POA: Diagnosis not present

## 2021-03-31 LAB — POCT URINALYSIS DIPSTICK, ED / UC
Bilirubin Urine: NEGATIVE
Glucose, UA: NEGATIVE mg/dL
Ketones, ur: NEGATIVE mg/dL
Leukocytes,Ua: NEGATIVE
Nitrite: NEGATIVE
Protein, ur: NEGATIVE mg/dL
Specific Gravity, Urine: 1.025 (ref 1.005–1.030)
Urobilinogen, UA: 0.2 mg/dL (ref 0.0–1.0)
pH: 6.5 (ref 5.0–8.0)

## 2021-03-31 NOTE — ED Provider Notes (Signed)
[facility]    CSN: [id number] Arrival date & time: 03/31/21  8299      History   Chief Complaint Chief Complaint  Patient presents with  . penile irritation    HPI Shane Gillespie is a 40 y.o. male.   Patient here for evaluation of penile discharge and irritation.  Reports symptoms have been ongoing for the past several weeks and also reports having a "weak erection."Patient has not tried any OTC treatments or medications. Denies any specific alleviating or aggravating factors.  Denies any fevers, chest pain, shortness of breath, N/V/D, numbness, tingling, weakness, abdominal pain, or headaches.   ROS: As per HPI, all other pertinent ROS negative   The history is provided by the patient.    History reviewed. No pertinent past medical history.  Patient Active Problem List   Diagnosis Date Noted  . 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 No pertinent surgical history.     Home Medications    Prior to Admission medications   Medication Sig Start Date End Date Taking? Authorizing Provider  doxycycline (VIBRA-TABS) 100 MG tablet Take 1 tablet (100 mg total) by mouth 2 (two) times daily. 02/09/21   Janeece Agee, NP  SUMAtriptan (IMITREX) 50 MG tablet Take 1 tablet (50 mg total) by mouth every 2 (two) hours as needed for migraine. May repeat in 2 hours if headache persists or recurs. 02/08/21   Janeece Agee, NP    Family History Family History  Problem Relation Age of Onset  . Healthy Mother   . Healthy Father     Social History Social History   Tobacco Use  . Smoking status: Never Smoker  . Smokeless tobacco: Never Used  Vaping Use  . Vaping Use: Never used  Substance Use Topics  . Alcohol use: No  . Drug use: No     Allergies   Patient has no known allergies.   Review of Systems Review of Systems  Genitourinary: Positive for penile discharge.  All other systems reviewed and are negative.    Physical Exam Triage Vital  Signs ED Triage Vitals  Enc Vitals Group     BP 03/31/21 0825 136/75     Pulse Rate 03/31/21 0825 62     Resp 03/31/21 0825 18     Temp 03/31/21 0825 98.3 F (36.8 C)     Temp src --      SpO2 03/31/21 0825 96 %     Weight --      Height --      Head Circumference --      Peak Flow --      Pain Score 03/31/21 0824 0     Pain Loc --      Pain Edu? --      Excl. in GC? --    No data found.  Updated Vital Signs BP 136/75   Pulse 62   Temp 98.3 F (36.8 C)   Resp 18   SpO2 96%   Visual Acuity Right Eye Distance:   Left Eye Distance:   Bilateral Distance:    Right Eye Near:   Left Eye Near:    Bilateral Near:     Physical Exam Vitals and nursing note reviewed.  Constitutional:      General: He is not in acute distress.    Appearance: Normal appearance. He is not ill-appearing, toxic-appearing or diaphoretic.  HENT:     Head: Normocephalic and atraumatic.  Eyes:     Conjunctiva/sclera:  Conjunctivae normal.  Cardiovascular:     Rate and Rhythm: Normal rate.     Pulses: Normal pulses.  Pulmonary:     Effort: Pulmonary effort is normal.  Abdominal:     General: Abdomen is flat.  Genitourinary:    Comments: declines Musculoskeletal:        General: Normal range of motion.     Cervical back: Normal range of motion.  Skin:    General: Skin is warm and dry.  Neurological:     General: No focal deficit present.     Mental Status: He is alert and oriented to person, place, and time.  Psychiatric:        Mood and Affect: Mood normal.      UC Treatments / Results  Labs (all labs ordered are listed, but only abnormal results are displayed) Labs Reviewed  POCT URINALYSIS DIPSTICK, ED / UC - Abnormal; Notable for the following components:      Result Value   Hgb urine dipstick TRACE (*)    All other components within normal limits  CYTOLOGY, (ORAL, ANAL, URETHRAL) ANCILLARY ONLY    EKG   Radiology No results found.  Procedures Procedures (including  critical care time)  Medications Ordered in UC Medications - No data to display  Initial Impression / Assessment and Plan / UC Course  I have reviewed the triage vital signs and the nursing notes.  Pertinent labs & imaging results that were available during my care of the patient were reviewed by me and considered in my medical decision making (see chart for details).    Penile irritation, screen for STDs Assessment negative for red flags or concerns. Urinalysis with no signs of infection.  Self swab obtained and will treat based on results. Recommend getting established and following up with PCP, may require referral to urology. Discussed safe sex practices including condom use and other barrier methods. Follow-up as needed  Final Clinical Impressions(s) / UC Diagnoses   Final diagnoses:  Penile irritation  Screen for STD (sexually transmitted disease)     Discharge Instructions     Your urine does not show any signs of infection.   Follow up a primary care provider.   We will contact you with the results from your lab work and any additional treatment.    Do not have sex while taking undergoing treatment for STI.  Make sure that all of your partners get tested and treated.   Use a condom or other barrier method for all sexual encounters.    Return or go to the Emergency Department if symptoms worsen or do not improve in the next few days.      ED Prescriptions    None     PDMP not reviewed this encounter.   Ivette Loyal, NP 03/31/21 [phone]

## 2021-03-31 NOTE — ED Triage Notes (Signed)
Pt in with c/o penile burning/itching and weak erection that has been going on for a few weeks  Also c/o yellow discharge

## 2021-03-31 NOTE — Discharge Instructions (Signed)
Your urine does not show any signs of infection.   Follow up a primary care provider.   We will contact you with the results from your lab work and any additional treatment.    Do not have sex while taking undergoing treatment for STI.  Make sure that all of your partners get tested and treated.   Use a condom or other barrier method for all sexual encounters.    Return or go to the Emergency Department if symptoms worsen or do not improve in the next few days.

## 2021-04-01 LAB — CYTOLOGY, (ORAL, ANAL, URETHRAL) ANCILLARY ONLY
Chlamydia: NEGATIVE
Comment: NEGATIVE
Comment: NEGATIVE
Comment: NORMAL
Neisseria Gonorrhea: NEGATIVE
Trichomonas: NEGATIVE

## 2022-02-17 ENCOUNTER — Other Ambulatory Visit: Payer: Self-pay

## 2022-02-17 ENCOUNTER — Ambulatory Visit: Payer: 59 | Admitting: Student

## 2022-02-17 ENCOUNTER — Encounter: Payer: Self-pay | Admitting: Student

## 2022-02-17 VITALS — BP 139/74 | HR 76 | Temp 98.4°F | Ht 67.0 in | Wt 236.6 lb

## 2022-02-17 DIAGNOSIS — Z1159 Encounter for screening for other viral diseases: Secondary | ICD-10-CM

## 2022-02-17 DIAGNOSIS — R030 Elevated blood-pressure reading, without diagnosis of hypertension: Secondary | ICD-10-CM | POA: Diagnosis not present

## 2022-02-17 DIAGNOSIS — E669 Obesity, unspecified: Secondary | ICD-10-CM

## 2022-02-17 DIAGNOSIS — Z114 Encounter for screening for human immunodeficiency virus [HIV]: Secondary | ICD-10-CM

## 2022-02-17 DIAGNOSIS — E6609 Other obesity due to excess calories: Secondary | ICD-10-CM

## 2022-02-17 DIAGNOSIS — Z8669 Personal history of other diseases of the nervous system and sense organs: Secondary | ICD-10-CM

## 2022-02-17 DIAGNOSIS — Z6830 Body mass index (BMI) 30.0-30.9, adult: Secondary | ICD-10-CM

## 2022-02-17 DIAGNOSIS — Z0000 Encounter for general adult medical examination without abnormal findings: Secondary | ICD-10-CM

## 2022-02-17 LAB — POCT GLYCOSYLATED HEMOGLOBIN (HGB A1C): Hemoglobin A1C: 5.3 % (ref 4.0–5.6)

## 2022-02-17 LAB — GLUCOSE, CAPILLARY: Glucose-Capillary: 87 mg/dL (ref 70–99)

## 2022-02-17 NOTE — Patient Instructions (Signed)
Thank you, [name] for allowing [place] to provide your care today. Today, we discussed your annual exam.  Your exam was normal. We are checking a few labs to make sure you do not have diabetes, high cholesterol, HIV infection or hep C infection. Continue to take over-the-counter Tylenol or ibuprofen for your occasional headaches.  If these headaches get worse, call our clinic so we can evaluate you and start you on a medication. It is important to get about 30 min of moderate amount of exercise each day and to continue to eat a healthy diet in order to stay healthy.  I have ordered the following labs for you:   Lab Orders         Lipid Profile         HIV antibody (with reflex)         Hepatitis C Ab reflex to Quant PCR         POC Hbg A1C      I will call if any are abnormal. All of your labs can be accessed through "My Chart".  My Chart Access: [email or web address]  Please follow-up in 6 months or as needed  Please make sure to arrive 15 minutes prior to your next appointment. If you arrive late, you may be asked to reschedule.    We look forward to seeing you next time. Please call our clinic at [phone] if you have any questions or concerns. The best time to call is Monday-Friday from 9am-4pm, but there is someone available 24/7. If after hours or the weekend, call the main hospital number and ask for the Internal Medicine Resident On-Call. If you need medication refills, please notify your pharmacy one week in advance and they will send [place] a request.   Thank you for letting us take part in your care. Wishing you the best!  Steffanie Rainwater, MD 02/17/2022, 3:10 PM IM Resident, PGY-2 [place] Heck 41:10

## 2022-02-17 NOTE — Progress Notes (Signed)
° °  CC: Establish care  HPI:  [name] is a 41 y.o. male with PMH as below who presents to clinic to establish care after his previous PCP office was shut down 2 years ago. Please see problem based charting for evaluation, assessment and plan.  Past medical history: Reports history of headaches that is intermittent in nature but controlled with Tylenol  Medications: Occasional Tylenol for headache  Allergies: NKDA  Surgical history: None  Family history: History of osteoarthritis in mother. History of hypertension in father who passed away from a stroke.  Social history: Person of [place] descent. Currently married lives at home with his wife and 4 children. 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 Used to go to Dr. Orland Mustard at [place] [facility] until the clinical shutdown. He has not seen his PCP in about 2 years. He does not smoke tobacco drink alcohol or use illicit drugs. States he is relatively healthy and eat mostly [place] dishes that includes rice, vegetables, meats and fruits.   Sexual history: States he is in a monogamous relationship with his wife. Does not use protection during intercourse. Denies any history of STDs.  Denies any penile discharge or pain.  Review of Systems:  Constitutional: Negative for fever or fatigue Eyes: Negative for visual changes Respiratory: Negative for shortness of breath Cardiac: Negative for chest pain MSK: Positive for occasional back pain Abdomen: Negative for abdominal pain, constipation or diarrhea Neuro: Positive for occasional headaches. Negative for dizziness or weakness  Physical Exam: General: Pleasant, well-appearing [place] middle aged man.  No acute distress. HEENT: MMM. PERRLA. No scleral icterus Cardiac: RRR. No murmurs, rubs or gallops. No LE edema Respiratory: Lungs CTAB. No wheezing or crackles. Abdominal: Soft, symmetric and non tender. Normal BS. Skin: Warm, dry and intact without rashes or lesions Extremities:  Atraumatic. Full ROM.  Radial pulses 2+ and symmetric. Neuro: A&O x 3. Moves all extremities. Strength 5/5 in lower extremities.  Normal sensation to gross touch. Psych: Appropriate mood and affect.  Vitals:   02/17/22 1415  BP: 139/74  Pulse: 76  Temp: 98.4 F (36.9 C)  TempSrc: Oral  SpO2: 100%  Weight: 236 lb 9.6 oz (107.3 kg)  Height: 5\' 7"  (1.702 m)    Assessment & Plan:   See Encounters Tab for problem based charting.  Patient discussed with Dr. Dani Gobble, MD, MPH

## 2022-02-18 ENCOUNTER — Encounter: Payer: Self-pay | Admitting: Student

## 2022-02-18 DIAGNOSIS — Z0000 Encounter for general adult medical examination without abnormal findings: Secondary | ICD-10-CM | POA: Insufficient documentation

## 2022-02-18 DIAGNOSIS — R030 Elevated blood-pressure reading, without diagnosis of hypertension: Secondary | ICD-10-CM | POA: Insufficient documentation

## 2022-02-18 DIAGNOSIS — Z8669 Personal history of other diseases of the nervous system and sense organs: Secondary | ICD-10-CM | POA: Insufficient documentation

## 2022-02-18 DIAGNOSIS — E669 Obesity, unspecified: Secondary | ICD-10-CM | POA: Insufficient documentation

## 2022-02-18 LAB — HCV AB W REFLEX TO QUANT PCR: HCV Ab: NONREACTIVE

## 2022-02-18 LAB — LIPID PANEL
Chol/HDL Ratio: 2.8 ratio (ref 0.0–5.0)
Cholesterol, Total: 141 mg/dL (ref 100–199)
HDL: 50 mg/dL (ref >39)
LDL Chol Calc (NIH): 74 mg/dL (ref 0–99)
Triglycerides: 91 mg/dL (ref 0–149)
VLDL Cholesterol Cal: 17 mg/dL (ref 5–40)

## 2022-02-18 LAB — HCV INTERPRETATION

## 2022-02-18 LAB — HIV ANTIBODY (ROUTINE TESTING W REFLEX): HIV Screen 4th Generation wRfx: NONREACTIVE

## 2022-02-18 NOTE — Assessment & Plan Note (Addendum)
41 year old gentleman with no significant past medical history here to establish care and get a yearly physical. Found to have BP of 139/74. Patient not on any medications for hypertension. Patient counseled on lifestyle modifications with exercising at least 30 minutes for 5 days a week and eating a healthy and well-balanced diet that is low in sodium to help manage blood pressure. -- Repeat BP at next office visit -- Follow-up in 6 months

## 2022-02-18 NOTE — Assessment & Plan Note (Signed)
41 year old [place] male here to establish care in this practice. Current weight is 236 lbs with a BMI of 37.06. 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 His diet include mostly [place] foods that has meats, carbs such as rice, fish, veggies and fruits. Patient has no history of diabetes and A1c today is 5.3%.  His cholesterol is within normal limits. Patient educated about obesity being a risk factor for developing hypertension and diabetes. Patient counseled on lifestyle modifications such as dietary changes and exercises. Advised to aim for at least 30 minutes of moderate exercise 5 days a week and decrease intake of carbohydrates, simple sugars and fried foods.  --Continue to monitor weight -- Lifestyle modifications for now

## 2022-02-18 NOTE — Assessment & Plan Note (Signed)
Patient reports history of headaches that was managed with a short course Imitrex by his previous provider years ago. He did not have much headaches for the last 1 to 2 years. The last few weeks he has had occasional temporal headache that he describes as 4/10 on the pain scale and relieved with OTC Tylenol.  He denies any aura or visual changes with his headaches.  -- Continue OTC Tylenol for headaches -- Advised to follow-up if headaches become severe or more frequent

## 2022-02-18 NOTE — Assessment & Plan Note (Signed)
Negative HIV and hepatitis C screening today. A1c normal at 5.3%. Cholesterol within normal limits.

## 2022-02-21 NOTE — Progress Notes (Signed)
Internal Medicine Clinic Attending  Case discussed with Dr. Amponsah  At the time of the visit.  We reviewed the resident's history and exam and pertinent patient test results.  I agree with the assessment, diagnosis, and plan of care documented in the resident's note.  

## 2022-04-11 ENCOUNTER — Telehealth: Payer: Self-pay

## 2022-04-11 NOTE — Telephone Encounter (Signed)
Return a call to pt - stated he might have allergies d/t pollen; he has not tried OTC med. An appt has been schedule tomorrow w/Dr Cyndie Chime. ?

## 2022-04-11 NOTE — Telephone Encounter (Signed)
Pt's calling back stating he's unable to come in for an appt tomorrow; he has to pick up his children. Appt changed to a telehealth appt. He asked if he can buy something OTC for the pollen/itchy eyes. I told him as long as he's not allergic to anything, he can try allergy med ie zyrtec, claritin, allegra. ?

## 2022-04-11 NOTE — Telephone Encounter (Signed)
Pt is requesting a call back .. he stated that his eyes has been itching him for a week now when asked if there is any drainage or  does he have a cold he stated no I gave him an appt tomorrow with Dr Cyndie Chime tomorrow at 3:15 just incase you need it .. he has been here only once saw dr Kirke Corin  ?

## 2022-04-12 ENCOUNTER — Ambulatory Visit (INDEPENDENT_AMBULATORY_CARE_PROVIDER_SITE_OTHER): Payer: 59 | Admitting: Student

## 2022-04-12 ENCOUNTER — Encounter: Payer: Self-pay | Admitting: Student

## 2022-04-12 DIAGNOSIS — H1013 Acute atopic conjunctivitis, bilateral: Secondary | ICD-10-CM

## 2022-04-12 DIAGNOSIS — H1010 Acute atopic conjunctivitis, unspecified eye: Secondary | ICD-10-CM | POA: Insufficient documentation

## 2022-04-12 NOTE — Assessment & Plan Note (Signed)
Patient endorses bilateral eyes pruritus that has been gone for a week.  Endorses mild conjunctival erythema due to eyes rubbing.  He denies excessive lacrimation, purulent discharge, sneezing, coughing, blurry vision, eye pain, or orbital swelling.  Denies any trauma to his eyes.  Denies fever or chills. ? ?He has had new prescription glasses in February and denies any changes in vision. ? ?States that his wife just got him Clear Eye drops and he has been using it with some relief. ? ?This is likely allergic conjunctivitis.  No red flag symptoms.  Advised patient to use Pataday instead of clear eye for relief.  Advised him to avoid rubbing his eyes to avoid trauma or infection.  Patient will call the clinic or go to his eye doctor if develop new symptoms. ?

## 2022-04-12 NOTE — Progress Notes (Signed)
?  [facility] Internal Medicine Residency Telephone Encounter ?Continuity Care Appointment ? ?HPI:  ?This telephone encounter was created for Mr. Shane Gillespie on 04/12/2022 for the following purpose/cc itchy eyes.  ? ?Past Medical History:  ?Past Medical History:  ?Diagnosis Date  ? Elevated BP without diagnosis of hypertension   ? Hx of migraines   ?  ? ?ROS:  ?Per HPI  ? ?Assessment / Plan / Recommendations:  ?Please see A&P under problem oriented charting for assessment of the patient's acute and chronic medical conditions.  ?As always, pt is advised that if symptoms worsen or new symptoms arise, they should go to an urgent care facility or to to ER for further evaluation.  ? ?Consent and Medical Decision Making:  ?Patient discussed with Dr. Heide Spark ?This is a telephone encounter between Shane Gillespie and Doran Stabler on 04/12/2022 for itchy eyes. The visit was conducted with the patient located at home and Doran Stabl[facility]. The patient's identity was confirmed using their DOB and current address. The patient has consented to being evaluated through a telephone encounter and understands the associated risks (an examination cannot be done and the patient may need to come in for an appointment) / benefits (allows the patient to remain at home, decreasing exposure to coronavirus). I personally spent 7 minutes on medical discussion.   ?  ?

## 2022-04-13 NOTE — Progress Notes (Signed)
Internal Medicine Clinic Attending  Case discussed with Dr. Nguyen  At the time of the visit.  We reviewed the resident's history and exam and pertinent patient test results.  I agree with the assessment, diagnosis, and plan of care documented in the resident's note. 

## 2022-07-27 ENCOUNTER — Telehealth: Payer: 59 | Admitting: Physician Assistant

## 2022-07-27 DIAGNOSIS — H1013 Acute atopic conjunctivitis, bilateral: Secondary | ICD-10-CM

## 2022-07-27 DIAGNOSIS — J310 Chronic rhinitis: Secondary | ICD-10-CM | POA: Diagnosis not present

## 2022-07-27 MED ORDER — LEVOCETIRIZINE DIHYDROCHLORIDE 5 MG PO TABS: 5.0000 mg | ORAL_TABLET | Freq: Every evening | ORAL | 1 refills | Status: DC

## 2022-07-27 MED ORDER — AZELASTINE HCL 0.05 % OP SOLN: 1.0000 [drp] | Freq: Two times a day (BID) | OPHTHALMIC | 3 refills | Status: DC

## 2022-07-27 NOTE — Progress Notes (Signed)
Virtual Visit Consent   Shane Gillespie, you are scheduled for a virtual visit with a [facility] provider today. Just as with appointments in the office, your consent must be obtained to participate. Your consent will be active for this visit and any virtual visit you may have with one of our providers in the next 365 days. If you have a MyChart account, a copy of this consent can be sent to you electronically.  As this is a virtual visit, video technology does not allow for your provider to perform a traditional examination. This may limit your provider's ability to fully assess your condition. If your provider identifies any concerns that need to be evaluated in person or the need to arrange testing (such as labs, EKG, etc.), we will make arrangements to do so. Although advances in technology are sophisticated, we cannot ensure that it will always work on either your end or our end. If the connection with a video visit is poor, the visit may have to be switched to a telephone visit. With either a video or telephone visit, we are not always able to ensure that we have a secure connection.  By engaging in this virtual visit, you consent to the provision of healthcare and authorize for your insurance to be billed (if applicable) for the services provided during this visit. Depending on your insurance coverage, you may receive a charge related to this service.  I need to obtain your verbal consent now. Are you willing to proceed with your visit today? Shane Gillespie has provided verbal consent on 07/27/2022 for a virtual visit (video or telephone). Piedad Climes, [place]  Date: 07/27/2022 8:12 AM  Virtual Visit via Video Note   I, Piedad Climes, connected with  Shane Gillespie  ([id number], 1981-05-31) on 07/27/22 at  7:45 AM EDT by a video-enabled telemedicine application and verified that I am speaking with the correct person using two identifiers.  Location: Patient: Virtual Visit Location  Patient: Home Provider: Virtual Visit Location Provider: Home Office   I discussed the limitations of evaluation and management by telemedicine and the availability of in person appointments. The patient expressed understanding and agreed to proceed.    History of Present Illness: Shane Gillespie is a 41 y.o. who identifies as a male who was assigned male at birth, and is being seen today for some recurrent itching and watering of his eyes, associated with eye redness. Also noting nasal congestion and rhinorrhea with sneezing. Denies any sinus pain, ear pain, facial pain. Denies fever or chills. Denies any shortness of breath or chest tightness. Notes having this issue back in the spring, starting OTC Pataday which helped but only slightly. [place] of [place] the last bit, having no symptoms at all. Returned to [place] 2 weeks ago with quick recurrence of symptoms. . Eye check in January/February -- some decreased vision in one of his eyes so was given prescription glasses with blue light blockers.     HPI: HPI  Problems:  Patient Active Problem List   Diagnosis Date Noted   Allergic conjunctivitis 04/12/2022   Elevated blood pressure reading 02/18/2022   Hx of migraines 02/18/2022   Healthcare maintenance 02/18/2022   Obesity (BMI 35.0-39.9 without comorbidity) 02/18/2022    Allergies: No Known Allergies Medications:  Current Outpatient Medications:    azelastine (OPTIVAR) 0.05 % ophthalmic solution, Place 1 drop into both eyes 2 (two) times daily., Disp: 6 mL, Rfl: 3   levocetirizine (XYZAL) 5 MG tablet,  Take 1 tablet (5 mg total) by mouth every evening., Disp: 30 tablet, Rfl: 1   Multiple Vitamin (MULTIVITAMIN WITH MINERALS) TABS tablet, Take 1 tablet by mouth daily., Disp: , Rfl:   Observations/Objective: Patient is well-developed, well-nourished in no acute distress.  Resting comfortably at home.  Head is normocephalic, atraumatic.  No labored breathing. Speech is clear and  coherent with logical content.  Patient is alert and oriented at baseline.  Bilateral conjunctival injection and tearing noted. No noted lid swelling or any evidence of subconjunctival hemorrhage from rubbing at eyes. Pupils are equal and round. EOMI.   Assessment and Plan: 1. Allergic conjunctivitis of both eyes - azelastine (OPTIVAR) 0.05 % ophthalmic solution; Place 1 drop into both eyes 2 (two) times daily.  Dispense: 6 mL; Refill: 3 - levocetirizine (XYZAL) 5 MG tablet; Take 1 tablet (5 mg total) by mouth every evening.  Dispense: 30 tablet; Refill: 1  2. Chronic rhinitis - levocetirizine (XYZAL) 5 MG tablet; Take 1 tablet (5 mg total) by mouth every evening.  Dispense: 30 tablet; Refill: 1  Start Optivar eye drops as directed. Will also start him on daily antihstistamine. Follow-up with PCP for any non-resolving symptoms and for ongoing management of chronic allergy symptoms.   Follow Up Instructions: I discussed the assessment and treatment plan with the patient. The patient was provided an opportunity to ask questions and all were answered. The patient agreed with the plan and demonstrated an understanding of the instructions.  A copy of instructions were sent to the patient via MyChart unless otherwise noted below.   The patient was advised to call back or seek an in-person evaluation if the symptoms worsen or if the condition fails to improve as anticipated.  Time:  I spent 10 minutes with the patient via telehealth technology discussing the above problems/concerns.    Piedad Climes, PA-C

## 2022-07-27 NOTE — Progress Notes (Signed)
Patient seen earlier today for allergic conjunctivitis. Needs medications sent to [facility] instead due to change in schedule and he is closer to his regular [facility]. RX switched to new pharmacy.   No charge.

## 2022-07-27 NOTE — Patient Instructions (Signed)
Shane Gillespie, thank you for joining Piedad Climes, PA-C for today's virtual visit.  While this provider is not your primary care provider (PCP), if your PCP is located in our provider database this encounter information will be shared with them immediately following your visit.  Consent: (Patient) Shane Gillespie provided verbal consent for this virtual visit at the beginning of the encounter.  Current Medications:  Current Outpatient Medications:    azelastine (OPTIVAR) 0.05 % ophthalmic solution, Place 1 drop into both eyes 2 (two) times daily., Disp: 6 mL, Rfl: 3   levocetirizine (XYZAL) 5 MG tablet, Take 1 tablet (5 mg total) by mouth every evening., Disp: 30 tablet, Rfl: 1   Multiple Vitamin (MULTIVITAMIN WITH MINERALS) TABS tablet, Take 1 tablet by mouth daily., Disp: , Rfl:    Medications ordered in this encounter:  Meds ordered this encounter  Medications   azelastine (OPTIVAR) 0.05 % ophthalmic solution    Sig: Place 1 drop into both eyes 2 (two) times daily.    Dispense:  6 mL    Refill:  3    Order Specific Question:   Supervising Provider    Answer:   Hyacinth Meeker, BRIAN [[id number]]   levocetirizine (XYZAL) 5 MG tablet    Sig: Take 1 tablet (5 mg total) by mouth every evening.    Dispense:  30 tablet    Refill:  1    Order Specific Question:   Supervising Provider    Answer:   Hyacinth Meeker, BRIAN [[id number]]     *If you need refills on other medications prior to your next appointment, please contact your pharmacy*  Follow-Up: Call back or seek an in-person evaluation if the symptoms worsen or if the condition fails to improve as anticipated.  Other Instructions Please start the Xyzal once daily and use the allergy eye drops prescribed as directed.  Follow-up with your PCP for ongoing management of chronic allergies, especially if not quickly improving/resolving with what I am giving you.  Allergic Conjunctivitis, Adult  Allergic conjunctivitis is inflammation of the clear  membrane (conjunctiva) that covers the white part of your eye and the inner surface of your eyelid. This condition can make your eye red or pink. It can also make your eye feel itchy. This condition cannot be spread from one person to another person (is not contagious). What are the causes? This condition is caused by allergens. These are things that can cause an allergic reaction in some people but not in others. Common allergens include: Outdoor allergens, such as: Pollen, including pollen from grass and weeds. Mold. Car fumes. Indoor allergens, such as: Dust. Smoke. Mold. Proteins in a pet's pee (urine), saliva, or dander. What increases the risk? You are more likely to develop this condition if you have a family history of these things: Allergies. Conditions that you get because of allergens, such as asthma or inflammation of the skin (eczema). What are the signs or symptoms? Symptoms of this condition include eyes that are: Itchy. Red. Watery. Puffy. Your eyes may also: Sting or burn. Have clear fluid draining from them. Have thick mucus coming from them. How is this treated? This condition may be treated with: Cold, wet cloths (cold compresses) to soothe itching and swelling. Washing the face to remove allergens. Eye drops. These may include: Eye drops that block allergies. Eye drops that reduce swelling and irritation. Steroid eye drops if other treatments have not worked. Oral antihistamine medicines. These medicines lessen your allergies. You may need these if eye  drops do not help or are difficult to use. Follow these instructions at home: Eye care Place a cool, clean washcloth on your eye for 10-20 minutes. Do this 3-4 times a day. Do not touch or rub your eyes. Do not wear contact lenses until the inflammation is gone. Wear glasses instead. Do not wear eye makeup until the inflammation is gone. General instructions Try not to be around things that you are  allergic to. Take or apply over-the-counter and prescription medicines only as told by your doctor. These include any eye drops. Drink enough fluid to keep your pee pale yellow. Keep all follow-up visits as told by your doctor. This is important. Contact a doctor if: Your symptoms get worse. Your symptoms do not get better with treatment. You have mild eye pain. You are sensitive to light. You have spots or blisters on your eyes. You have pus coming from your eye. You have a fever. Get help right away if: You have redness, swelling, or other symptoms in only one eye. You cannot see well. You have other vision changes. You have very bad eye pain. Summary Allergic conjunctivitis is caused by allergens. It can make your eye red or pink, and it can make your eye feel itchy. This condition cannot be spread from one person to another person (is not contagious). Try not to be around things that you are allergic to. Take or apply over-the-counter and prescription medicines only as told by your doctor. These include any eye drops. Contact your doctor if your symptoms get worse or they do not get better with treatment. This information is not intended to replace advice given to you by your health care provider. Make sure you discuss any questions you have with your health care provider. Document Revised: 11/04/2019 Document Reviewed: 11/04/2019 Elsevier Patient Education  2023 Elsevier Inc.    If you have been instructed to have an in-person evaluation today at a local Urgent Care facility, please use the link below. It will take you to a list of all of our available [facility], including address, phone number and hours of operation. Please do not delay care.  [facility]  If you or a family member do not have a primary care provider, use the link below to schedule a visit and establish care. When you choose a [facility] primary care physician or advanced practice  provider, you gain a long-term partner in health. Find a Primary Care Provider  Learn more about [facility] in-office and virtual care options: [facility] - Get Care Now

## 2023-01-11 ENCOUNTER — Encounter: Payer: 59 | Admitting: Student

## 2023-01-13 ENCOUNTER — Ambulatory Visit: Payer: 59 | Admitting: Student

## 2023-01-13 ENCOUNTER — Encounter: Payer: Self-pay | Admitting: Student

## 2023-01-13 VITALS — BP 135/48 | HR 74 | Temp 98.2°F | Ht 67.0 in | Wt 213.3 lb

## 2023-01-13 DIAGNOSIS — Z0000 Encounter for general adult medical examination without abnormal findings: Secondary | ICD-10-CM

## 2023-01-13 DIAGNOSIS — E6609 Other obesity due to excess calories: Secondary | ICD-10-CM | POA: Diagnosis not present

## 2023-01-13 DIAGNOSIS — R030 Elevated blood-pressure reading, without diagnosis of hypertension: Secondary | ICD-10-CM

## 2023-01-13 DIAGNOSIS — Z6830 Body mass index (BMI) 30.0-30.9, adult: Secondary | ICD-10-CM

## 2023-01-13 DIAGNOSIS — Z23 Encounter for immunization: Secondary | ICD-10-CM

## 2023-01-13 DIAGNOSIS — E669 Obesity, unspecified: Secondary | ICD-10-CM

## 2023-01-13 DIAGNOSIS — E66811 Obesity, class 1: Secondary | ICD-10-CM

## 2023-01-13 NOTE — Assessment & Plan Note (Signed)
Received the flu shot today

## 2023-01-13 NOTE — Patient Instructions (Addendum)
Thank you, [name] for allowing [place] to provide your care today. Today we discussed your physical, weight, and screening for cholesterol and diabetes.  Great job losing 23 pounds since your last office visit.  Continue working on your diet and try to exercise for about 30 minutes 5 times a day.  I have ordered the following labs for you:   Lab Orders         Lipid Profile         Hemoglobin A1c      I will call if any are abnormal. All of your labs can be accessed through "My Chart".  My Chart Access: [email or web address]  Please follow-up in 1 year  Please make sure to arrive 15 minutes prior to your next appointment. If you arrive late, you may be asked to reschedule.    We look forward to seeing you next time. Please call our clinic at [phone] if you have any questions or concerns. The best time to call is Monday-Friday from 9am-4pm, but there is someone available 24/7. If after hours or the weekend, call the main hospital number and ask for the Internal Medicine Resident On-Call. If you need medication refills, please notify your pharmacy one week in advance and they will send [place] a request.   Thank you for letting us take part in your care. Wishing you the best!  Lacinda Axon, MD 01/13/2023, 9:46 AM IM Resident, PGY-3 [facility]al[address] Milian 41:10

## 2023-01-13 NOTE — Assessment & Plan Note (Signed)
Patient here for his 1-year follow-up visit. Patient states he has been doing well has no specific complaints. Patient has lost 23 lbs since his last visit about 1 year ago. States his wife has been cooking more meals vegetables, and fish. He also works at [employer] and constantly moving around. I congratulated him on his weight loss and encouraged him to continue eating healthy and exercising to decrease his risk of developing metabolic syndrome. Body mass index is 33.41 kg/m. Filed Weights   01/13/23 0901  Weight: 213 lb 4.8 oz (96.8 kg)   Plan: -Continue lifestyle modifications with dietary changes and exercise -Follow-up repeat A1c, lipid panel -Follow-up in 1 year

## 2023-01-13 NOTE — Progress Notes (Signed)
   CC: Yearly physical  HPI:  [name] is a 42 y.o. male with PMH as below who presents for 1 year physical. Please see problem based charting for evaluation, assessment and plan.  Past Medical History:  Diagnosis Date   Elevated BP without diagnosis of hypertension    Hx of migraines     Review of Systems:  Constitutional: Negative for dizziness or fatigue Eyes: Negative for visual changes Respiratory: Negative for shortness of breath Cardiac: Negative for chest pain MSK: Negative for back pain. Positive for occasional pain in his right thumb from prior injury. Abdomen: Negative for abdominal pain, constipation or diarrhea Neuro: Negative for headache or weakness  Physical Exam: General: Pleasant, well-appearing middle-age man. No acute distress. HEENT: PERRLA. EOMI. Neck: Supple.  Normal ROM. Cardiac: RRR. No murmurs, rubs or gallops. No LE edema Respiratory: Lungs CTAB. No wheezing or crackles. Abdominal: Soft, symmetric and non tender. Normal BS. Skin: Warm, dry and intact without rashes or lesions Extremities: Radial and DP pulses 2+ and symmetric. Neuro: A&O x 3. Moves all extremities Psych: Appropriate mood and affect.  Vitals:   01/13/23 0901 01/13/23 0933  BP: (!) 123/44 (!) 135/48  Pulse: 74   Temp: 98.2 F (36.8 C)   TempSrc: Oral   SpO2: 100%   Weight: 213 lb 4.8 oz (96.8 kg)   Height: 5\' 7"  (1.702 m)     Assessment & Plan:   Obesity (BMI 35.0-39.9 without comorbidity) Patient here for his 1-year follow-up visit. Patient states he has been doing well has no specific complaints. Patient has lost 23 lbs since his last visit about 1 year ago. States his wife has been cooking more meals vegetables, and fish. He also works at [employer] and constantly moving around. I congratulated him on his weight loss and encouraged him to continue eating healthy and exercising to decrease his risk of developing metabolic syndrome. Body mass index is 33.41 kg/m. Filed  Weights   01/13/23 0901  Weight: 213 lb 4.8 oz (96.8 kg)   Plan: -Continue lifestyle modifications with dietary changes and exercise -Follow-up repeat A1c, lipid panel -Follow-up in 1 year  Elevated blood pressure reading SBP improved to the 120s to 130s however patient's pulse pressure is elevated today. He denies any dizziness, headaches, fatigue, chest pain, shortness of breath, palpitations or syncope. He has no murmur on exam and not hypovolemic. If his elevated pulse pressure persists at next office visit, will require further evaluation to assess for hyperthyroidism, OSA, anemia or aortic insufficiency. -Repeat BP at last office visit  Healthcare maintenance Received the flu shot today    See Encounters Tab for problem based charting.  Patient discussed with Dr.  Louann Liv, MD, MPH

## 2023-01-13 NOTE — Assessment & Plan Note (Signed)
SBP improved to the 120s to 130s however patient's pulse pressure is elevated today. He denies any dizziness, headaches, fatigue, chest pain, shortness of breath, palpitations or syncope. He has no murmur on exam and not hypovolemic. If his elevated pulse pressure persists at next office visit, will require further evaluation to assess for hyperthyroidism, OSA, anemia or aortic insufficiency. -Repeat BP at last office visit

## 2023-01-14 LAB — HEMOGLOBIN A1C
Est. average glucose Bld gHb Est-mCnc: 120 mg/dL
Hgb A1c MFr Bld: 5.8 % — ABNORMAL HIGH (ref 4.8–5.6)

## 2023-01-14 LAB — LIPID PANEL
Chol/HDL Ratio: 2.9 ratio (ref 0.0–5.0)
Cholesterol, Total: 164 mg/dL (ref 100–199)
HDL: 57 mg/dL (ref >39)
LDL Chol Calc (NIH): 95 mg/dL (ref 0–99)
Triglycerides: 58 mg/dL (ref 0–149)
VLDL Cholesterol Cal: 12 mg/dL (ref 5–40)

## 2023-01-17 NOTE — Progress Notes (Signed)
Lipid panel showed LDL at goal at 95 but increased from 74 one year ago. A1c shows that patient now in the prediabetes range.  Attempted to reach patient multiple times without success. Counseled patient during encounter about dietary changes and exercise to help with further weight loss.

## 2023-01-25 NOTE — Progress Notes (Signed)
Internal Medicine Clinic Attending  Case discussed with Dr. Amponsah  At the time of the visit.  We reviewed the resident's history and exam and pertinent patient test results.  I agree with the assessment, diagnosis, and plan of care documented in the resident's note.  

## 2023-02-16 ENCOUNTER — Telehealth: Payer: 59 | Admitting: Physician Assistant

## 2023-02-16 DIAGNOSIS — R0981 Nasal congestion: Secondary | ICD-10-CM | POA: Diagnosis not present

## 2023-02-16 DIAGNOSIS — L02411 Cutaneous abscess of right axilla: Secondary | ICD-10-CM | POA: Diagnosis not present

## 2023-02-16 MED ORDER — DOXYCYCLINE HYCLATE 100 MG PO TABS: 100.0000 mg | ORAL_TABLET | Freq: Two times a day (BID) | ORAL | 0 refills | Status: DC

## 2023-02-16 MED ORDER — FLUTICASONE PROPIONATE 50 MCG/ACT NA SUSP: 2.0000 | Freq: Every day | NASAL | 0 refills | Status: DC

## 2023-02-16 MED ORDER — PREDNISONE 20 MG PO TABS: 40.0000 mg | ORAL_TABLET | Freq: Every day | ORAL | 0 refills | Status: DC

## 2023-02-16 NOTE — Progress Notes (Signed)
Virtual Visit Consent   Shane Gillespie, you are scheduled for a virtual visit with a [facility] provider today. Just as with appointments in the office, your consent must be obtained to participate. Your consent will be active for this visit and any virtual visit you may have with one of our providers in the next 365 days. If you have a MyChart account, a copy of this consent can be sent to you electronically.  As this is a virtual visit, video technology does not allow for your provider to perform a traditional examination. This may limit your provider's ability to fully assess your condition. If your provider identifies any concerns that need to be evaluated in person or the need to arrange testing (such as labs, EKG, etc.), we will make arrangements to do so. Although advances in technology are sophisticated, we cannot ensure that it will always work on either your end or our end. If the connection with a video visit is poor, the visit may have to be switched to a telephone visit. With either a video or telephone visit, we are not always able to ensure that we have a secure connection.  By engaging in this virtual visit, you consent to the provision of healthcare and authorize for your insurance to be billed (if applicable) for the services provided during this visit. Depending on your insurance coverage, you may receive a charge related to this service.  I need to obtain your verbal consent now. Are you willing to proceed with your visit today? Shane Gillespie has provided verbal consent on 02/16/2023 for a virtual visit (video or telephone). Leeanne Rio, [place]  Date: 02/16/2023 12:58 PM  Virtual Visit via Video Note   I, Leeanne Rio, connected with  Shane Gillespie  ([id number], [birth year]) on 02/16/23 at 12:15 PM EST by a video-enabled telemedicine application and verified that I am speaking with the correct person using two identifiers.  Location: Patient: Virtual Visit Location  Patient: Home Provider: Virtual Visit Location Provider: Home Office   I discussed the limitations of evaluation and management by telemedicine and the availability of in person appointments. The patient expressed understanding and agreed to proceed.    History of Present Illness: Shane Gillespie is a 42 y.o. who identifies as a male who was assigned male at birth, and is being seen today for 2 weeks of nasal congestion and sinus pressure with stopped up nose. Sometimes will blow and get clear drainage out. Other times, cannot get anything to come out. Denies fever, chills. Has used OTC Cold and Flu, Dayquil without much improvement.  With job -- harder to take as directed. Denies sinus pain, tooth pain or ear pain. Has environmental allergies but not currently on medications.  Patient also with history of axillary boils. Has not had issue in some time but in past 5 days notes development of a boil in R armpit that is tender and enlarged. No noted drainage. Denies fever, chills. Has required antibiotic therapy in the past.    HPI: HPI  Problems:  Patient Active Problem List   Diagnosis Date Noted   Allergic conjunctivitis 04/12/2022   Elevated blood pressure reading 02/18/2022   Hx of migraines 02/18/2022   Healthcare maintenance 02/18/2022   Obesity (BMI 35.0-39.9 without comorbidity) 02/18/2022    Allergies: No Known Allergies Medications:  Current Outpatient Medications:    doxycycline (VIBRA-TABS) 100 MG tablet, Take 1 tablet (100 mg total) by mouth 2 (two) times daily., Disp: 14 tablet, Rfl: 0  fluticasone (FLONASE) 50 MCG/ACT nasal spray, Place 2 sprays into both nostrils daily., Disp: 16 g, Rfl: 0   predniSONE (DELTASONE) 20 MG tablet, Take 2 tablets (40 mg total) by mouth daily with breakfast., Disp: 10 tablet, Rfl: 0   Multiple Vitamin (MULTIVITAMIN WITH MINERALS) TABS tablet, Take 1 tablet by mouth daily., Disp: , Rfl:   Observations/Objective: Patient is well-developed,  well-nourished in no acute distress.  Resting comfortably at home.  Head is normocephalic, atraumatic.  No labored breathing.  Speech is clear and coherent with logical content.  Patient is alert and oriented at baseline.  R axillary region with about a 3 cm boil noted medially in the mid axillary region. No noted drainage. Some mild surrounding erythema noted.   Assessment and Plan: 1. Nasal congestion - fluticasone (FLONASE) 50 MCG/ACT nasal spray; Place 2 sprays into both nostrils daily.  Dispense: 16 g; Refill: 0 - predniSONE (DELTASONE) 20 MG tablet; Take 2 tablets (40 mg total) by mouth daily with breakfast.  Dispense: 10 tablet; Refill: 0  Will start OTC antihistamine. Flonase per orders. Start short course of prednisone giving the amount of inflammation her is dealing with.   2. Abscess of axilla, right - doxycycline (VIBRA-TABS) 100 MG tablet; Take 1 tablet (100 mg total) by mouth 2 (two) times daily.  Dispense: 14 tablet; Refill: 0  Supportive measures reviewed. Start Doxycycline per orders.   Follow Up Instructions: I discussed the assessment and treatment plan with the patient. The patient was provided an opportunity to ask questions and all were answered. The patient agreed with the plan and demonstrated an understanding of the instructions.  A copy of instructions were sent to the patient via MyChart unless otherwise noted below.   The patient was advised to call back or seek an in-person evaluation if the symptoms worsen or if the condition fails to improve as anticipated.  Time:  I spent 10 minutes with the patient via telehealth technology discussing the above problems/concerns.    Leeanne Rio, PA-C

## 2023-02-16 NOTE — Patient Instructions (Signed)
  Shane Gillespie, thank you for joining Leeanne Rio, PA-C for today's virtual visit.  While this provider is not your primary care provider (PCP), if your PCP is located in our provider database this encounter information will be shared with them immediately following your visit.   [facility] account gives you access to today's visit and all your visits, tests, and labs performed at [facility] " click here if you don't have a [facility] account or go to [email or web address]  Consent: (Patient) Shane Gillespie provided verbal consent for this virtual visit at the beginning of the encounter.  Current Medications:  Current Outpatient Medications:    azelastine (OPTIVAR) 0.05 % ophthalmic solution, Place 1 drop into both eyes 2 (two) times daily., Disp: 6 mL, Rfl: 3   Multiple Vitamin (MULTIVITAMIN WITH MINERALS) TABS tablet, Take 1 tablet by mouth daily., Disp: , Rfl:    Medications ordered in this encounter:  No orders of the defined types were placed in this encounter.    *If you need refills on other medications prior to your next appointment, please contact your pharmacy*  Follow-Up: Call back or seek an in-person evaluation if the symptoms worsen or if the condition fails to improve as anticipated.  [facility] [phone]  Other Instructions Take prescribed medications as directed. Start OTC Xyzal once daily to further help nasal congestion and inflammation.  For boil, apply warm compresses as discussed in addition to taking your antibiotic as directed.  If either issue is not resolving with treatment, please be evaluated in person.    If you have been instructed to have an in-person evaluation today at a local Urgent Care facility, please use the link below. It will take you to a list of all of our available [facility], including address, phone number and hours of operation. Please do not delay care.  [facility]  If you or a family member do not have a primary care provider, use the link below to schedule a visit and establish care. When you choose a [facility] primary care physician or advanced practice provider, you gain a long-term partner in health. Find a Primary Care Provider  Learn more about [facility]'s in-office and virtual care options: [facility]

## 2023-05-21 ENCOUNTER — Encounter: Payer: Self-pay | Admitting: *Deleted

## 2023-09-07 ENCOUNTER — Ambulatory Visit: Payer: 59 | Admitting: Internal Medicine

## 2023-09-07 VITALS — BP 121/62 | HR 60 | Temp 98.8°F | Wt 233.4 lb

## 2023-09-07 DIAGNOSIS — Z0000 Encounter for general adult medical examination without abnormal findings: Secondary | ICD-10-CM

## 2023-09-07 DIAGNOSIS — R7303 Prediabetes: Secondary | ICD-10-CM | POA: Diagnosis not present

## 2023-09-07 DIAGNOSIS — R0981 Nasal congestion: Secondary | ICD-10-CM

## 2023-09-07 DIAGNOSIS — J988 Other specified respiratory disorders: Secondary | ICD-10-CM | POA: Diagnosis not present

## 2023-09-07 DIAGNOSIS — R030 Elevated blood-pressure reading, without diagnosis of hypertension: Secondary | ICD-10-CM

## 2023-09-07 NOTE — Patient Instructions (Signed)
[name],   It was a pleasure to meet you today. We will check some bloodwork including your A1c, lipid profile, and an HIV test. For your headaches, I recommend some Tylenol and heating pad on your neck. We can plan to see you again in about 1 year.   Thanks,  Monna Fam, MD
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
## 2023-09-07 NOTE — Assessment & Plan Note (Signed)
A1c at last visit was 5.8. Patient was interested in having this rechecked. 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  -A1c today

## 2023-09-07 NOTE — Assessment & Plan Note (Addendum)
Patient reports mild throat congestion and loud breathing for the past couple weeks. He feels this started when his kids began school this year. He has also had some mild headaches and occasionally feeling warm. He is afebrile today. Lungs are clear to auscultation bilaterally. Throat is appropriately pink with no inflammation. He is fully Covid vaccinated. Denies nausea, vomiting. We discussed viral illnesses including Covid, and how supportive treatment for symptoms including Tylenol and decongestants is most appropriate. Discussed going to the ED if he acutely worsens with shortness of breath or fever. Patient was amenable to plan.
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
## 2023-09-07 NOTE — Assessment & Plan Note (Addendum)
Patient's BP is 121/62 today, at goal. Not currently on any antihypertensives.

## 2023-09-08 LAB — LIPID PANEL
Chol/HDL Ratio: 3 ratio (ref 0.0–5.0)
Cholesterol, Total: 154 mg/dL (ref 100–199)
HDL: 52 mg/dL (ref >39)
LDL Chol Calc (NIH): 88 mg/dL (ref 0–99)
Triglycerides: 71 mg/dL (ref 0–149)
VLDL Cholesterol Cal: 14 mg/dL (ref 5–40)

## 2023-09-08 LAB — HEMOGLOBIN A1C
Est. average glucose Bld gHb Est-mCnc: 120 mg/dL
Hgb A1c MFr Bld: 5.8 % — ABNORMAL HIGH (ref 4.8–5.6)

## 2023-09-08 LAB — HIV ANTIBODY (ROUTINE TESTING W REFLEX): HIV Screen 4th Generation wRfx: NONREACTIVE

## 2023-09-08 NOTE — Progress Notes (Signed)
Internal Medicine Clinic Attending  I was physically present during the key portions of the resident provided service and participated in the medical decision making of patient's management care. I reviewed pertinent patient test results.  The assessment, diagnosis, and plan were formulated together and I agree with the documentation in the resident's note.  Gust Rung, DO

## 2023-09-11 ENCOUNTER — Ambulatory Visit (INDEPENDENT_AMBULATORY_CARE_PROVIDER_SITE_OTHER): Payer: 59 | Admitting: Student

## 2023-09-11 DIAGNOSIS — Z8669 Personal history of other diseases of the nervous system and sense organs: Secondary | ICD-10-CM

## 2023-09-11 NOTE — Progress Notes (Signed)
Attempted to call pt x4, left multiple voicemails. Pt did not call back, no charge.

## 2023-10-02 ENCOUNTER — Telehealth: Payer: 59 | Admitting: Student

## 2024-01-31 ENCOUNTER — Ambulatory Visit (INDEPENDENT_AMBULATORY_CARE_PROVIDER_SITE_OTHER): Payer: 59 | Admitting: Student

## 2024-01-31 ENCOUNTER — Encounter: Payer: Self-pay | Admitting: Student

## 2024-01-31 VITALS — BP 123/67 | HR 73 | Ht 67.0 in | Wt 236.2 lb

## 2024-01-31 DIAGNOSIS — R7303 Prediabetes: Secondary | ICD-10-CM

## 2024-01-31 DIAGNOSIS — E669 Obesity, unspecified: Secondary | ICD-10-CM | POA: Diagnosis not present

## 2024-01-31 DIAGNOSIS — Z23 Encounter for immunization: Secondary | ICD-10-CM | POA: Insufficient documentation

## 2024-01-31 DIAGNOSIS — R030 Elevated blood-pressure reading, without diagnosis of hypertension: Secondary | ICD-10-CM | POA: Diagnosis not present

## 2024-01-31 LAB — POCT GLYCOSYLATED HEMOGLOBIN (HGB A1C): Hemoglobin A1C: 5.8 % — AB (ref 4.0–5.6)

## 2024-01-31 LAB — GLUCOSE, CAPILLARY: Glucose-Capillary: 102 mg/dL — ABNORMAL HIGH (ref 70–99)

## 2024-01-31 NOTE — Assessment & Plan Note (Signed)
Patient has a BMI of 36.  This puts him at risk for metabolic syndrome.  Counseled on weight loss today.  Plan: -Counseled on weight loss

## 2024-01-31 NOTE — Progress Notes (Signed)
   CC: Prediabetes follow-up  HPI:  Shane Gillespie is a 43 y.o. male with a past medical history of elevated blood pressure, migraines, prediabetes who presents for follow-up appointment.  Please see assessment and plan for full HPI.  Medication: Allergic rhinitis: Flonase  2 sprays daily Multivitamin one a day   Past Medical History:  Diagnosis Date   Elevated BP without diagnosis of hypertension    Hx of migraines      Current Outpatient Medications:    Multiple Vitamin (MULTIVITAMIN WITH MINERALS) TABS tablet, Take 1 tablet by mouth daily., Disp: , Rfl:   Review of Systems:    Negative except for what is stated in HPI  Physical Exam:  Vitals:   01/31/24 0917  BP: 123/67  Pulse: 73  Weight: 236 lb 3.2 oz (107.1 kg)  Height: 5' 7 (1.702 m)   General: Patient is sitting comfortably in the room  ENT: TMs clear, oropharynx clear Cardio: Regular rate and rhythm, no murmurs, rubs or gallops Pulmonary: Clear to ausculation bilaterally with no rales, rhonchi, and crackles    Assessment & Plan:   Elevated blood pressure reading Patient has a past medical history of elevated blood pressure readings.  Blood pressure today is 123/67.  He denies any headaches, lightheadedness, shortness of breath, or chest pain.  He reports he is doing well.  Blood pressure measuring well.  No acute concerns.  Plan: -Continue to monitor blood pressure -Continue lifestyle modifications  Prediabetes Most recent A1c 5.8 in September 2024.  This puts him in the prediabetes category.  He states he has been trying to make lifestyle modifications.  Will check A1c today.  Plan: -A1c 5.8 today -Counseled on lifestyle modifications  Obesity (BMI 35.0-39.9 without comorbidity) Patient has a BMI of 36.  This puts him at risk for metabolic syndrome.  Counseled on weight loss today.  Plan: -Counseled on weight loss  Influenza vaccine administered Flu vaccine administered today.  Patient  discussed with Dr.  Karna Libby Blanch, DO PGY-2 Internal Medicine Resident  Pager: [phone]

## 2024-01-31 NOTE — Assessment & Plan Note (Signed)
 Patient has a past medical history of elevated blood pressure readings.  Blood pressure today is 123/67.  He denies any headaches, lightheadedness, shortness of breath, or chest pain.  He reports he is doing well.  Blood pressure measuring well.  No acute concerns.  Plan: -Continue to monitor blood pressure -Continue lifestyle modifications

## 2024-01-31 NOTE — Addendum Note (Signed)
 Addended by: Bevelyn Bryant on: 01/31/2024 11:19 AM   Modules accepted: Level of Service

## 2024-01-31 NOTE — Progress Notes (Signed)
 Internal Medicine Clinic Attending  Case discussed with the resident at the time of the visit.  We reviewed the resident's history and exam and pertinent patient test results.  I agree with the assessment, diagnosis, and plan of care documented in the resident's note.

## 2024-01-31 NOTE — Patient Instructions (Signed)
 Marcelo Skora,Thank you for allowing me to take part in your care today.  Here are your instructions.  1.  Will check your A1c today.  2.  We will update your flu vaccine today.  If you get some chills or fevers, please take Tylenol.  3.  Please come back in 6 months and at that time we can check your cholesterol and your A1c.  Thank you, Dr. Tobie  If you have any other questions please contact the internal medicine clinic at [phone] If it is after hours, please call the [facility] at [phone] and then ask the person who picks up for the resident on call.

## 2024-01-31 NOTE — Assessment & Plan Note (Signed)
 Flu vaccine administered today.

## 2024-01-31 NOTE — Assessment & Plan Note (Addendum)
 Most recent A1c 5.8 in September 2024.  This puts him in the prediabetes category.  He states he has been trying to make lifestyle modifications.  Will check A1c today.  Plan: -A1c 5.8 today -Counseled on lifestyle modifications

## 2024-03-05 ENCOUNTER — Telehealth: Payer: Self-pay | Admitting: *Deleted

## 2024-03-05 NOTE — Telephone Encounter (Signed)
 rom: Yovanny, [place] "Olu"  Sent: 03/05/2024   4:27 AM EDT  To: Imp Admin  Subject: Appointment Request                              Appointment Request From: Jenetta Loges    With Provider: Modena Slater [facility]]    Preferred Date Range: 03/07/2024 - 03/14/2024    Preferred Times: Monday Morning    Reason for visit: Office Visit    Health Maintenance Topic:    Comments:  I am having some chest pain

## 2024-03-05 NOTE — Telephone Encounter (Signed)
 I called pt - went straight to vm. Left message of office's return call and if pt has active CP, to go to the ER.

## 2024-03-06 ENCOUNTER — Ambulatory Visit: Payer: Self-pay | Admitting: Internal Medicine

## 2024-03-06 ENCOUNTER — Telehealth: Payer: Self-pay | Admitting: Internal Medicine

## 2024-03-06 NOTE — Telephone Encounter (Signed)
 Left VM for patient to return call to reschedule appointment.

## 2024-03-06 NOTE — Telephone Encounter (Addendum)
  Chief Complaint: chest discomfort Symptoms: pain Frequency: Occurred twice previously, no pain at this time Pertinent Negatives: Patient denies SOB, nausea, sweating Disposition: [] ED /[] Urgent Care (no appt availability in office) / [x] Appointment(In office/virtual)/ []  [facility]/ [] Home Care/ [] Refused Recommended Disposition /[] [facility]/ []  Follow-up with PCP Additional Notes: Patient calls reporting two episodes of chest pain on L chest that have resolved. Patient reports he recently began working out two weeks ago and feels that it may be muscular pain. States pain is nonradiating and 1/10 pain when occurring. Per protocol, patient to be evaluated within 24 hours. This RN contacted clinic and spoke to [place] who advised there is no availability in clinic until 03/11/24. Patient requests earliest appt in nearby clinic for tomorrow due to work schedule. This RN contacted [facility] again for clarification, Mae advised per Engineer, manufacturing if no availability patient can be scheduled elsewhere. Patient scheduled with first available provider in alternate clinic for 03/07/24 @0830 . Care advice reviewed, patient verbalized understanding and denies further questions at this time. Alerting PCP for review.    Copied from CRM [id number]. Topic: Clinical - Red Word Triage >> Mar 06, 2024  8:24 AM Philippa Chester F wrote: Red Word that prompted transfer to Nurse Triage: sensation in left side of chest; pain Reason for Disposition  [1] Chest pain lasts > 5 minutes AND [2] occurred > 3 days ago (72 hours) AND [3] NO chest pain or cardiac symptoms now  Answer Assessment - Initial Assessment Questions 1. LOCATION: "Where does it hurt?"        L upper chest 2. RADIATION: "Does the pain go anywhere else?" (e.g., into neck, jaw, arms, back)     Nonradiating 3. ONSET: "When did the chest pain begin?" (Minutes, hours or days)      No pain while talking, but felt it two days ago around 1800 and  yesterday. 4. PATTERN: "Does the pain come and go, or has it been constant since it started?"  "Does it get worse with exertion?"      Denies 5. DURATION: "How long does it last" (e.g., seconds, minutes, hours)     Maybe 30 minutes to 1 hour 6. SEVERITY: "How bad is the pain?"  (e.g., Scale 1-10; mild, moderate, or severe)    - MILD (1-3): doesn't interfere with normal activities     - MODERATE (4-7): interferes with normal activities or awakens from sleep    - SEVERE (8-10): excruciating pain, unable to do any normal activities       No pain at this time, but when occurring 1/10. 7. CARDIAC RISK FACTORS: "Do you have any history of heart problems or risk factors for heart disease?" (e.g., angina, prior heart attack; diabetes, high blood pressure, high cholesterol, smoker, or strong family history of heart disease)     Prediabetes 8. PULMONARY RISK FACTORS: "Do you have any history of lung disease?"  (e.g., blood clots in lung, asthma, emphysema, birth control pills)     Denies 9. CAUSE: "What do you think is causing the chest pain?"     Began exercising two weeks ago, could be contributing. 10. OTHER SYMPTOMS: "Do you have any other symptoms?" (e.g., dizziness, nausea, vomiting, sweating, fever, difficulty breathing, cough)       Denies  Protocols used: Chest Pain-A-AH

## 2024-03-07 ENCOUNTER — Ambulatory Visit: Admitting: Family Medicine

## 2024-04-24 ENCOUNTER — Ambulatory Visit: Admitting: Internal Medicine

## 2024-04-24 ENCOUNTER — Other Ambulatory Visit (HOSPITAL_COMMUNITY)
Admission: RE | Admit: 2024-04-24 | Discharge: 2024-04-24 | Disposition: A | Discharge status: Home / Self Care | Source: Ambulatory Visit | Attending: Internal Medicine | Admitting: Internal Medicine

## 2024-04-24 VITALS — BP 119/67 | HR 77 | Temp 98.2°F | Wt 233.1 lb

## 2024-04-24 DIAGNOSIS — N529 Male erectile dysfunction, unspecified: Secondary | ICD-10-CM | POA: Insufficient documentation

## 2024-04-24 MED ORDER — SILDENAFIL CITRATE 50 MG PO TABS: 50.0000 mg | ORAL_TABLET | ORAL | 0 refills | Status: AC | PRN

## 2024-04-24 NOTE — Patient Instructions (Signed)
 It was a pleasure to care for you today!  I have prescribed a few doses of an as-needed medication for your erectile concerns. If you sustain an erection for more than 4 hours please go to the emergency room.  I will let you know what your lab tests show.  My best, Dr. Rozelle Corning

## 2024-04-24 NOTE — Progress Notes (Signed)
   CC: erection difficulties  HPI:  Shane Gillespie is a 43 y.o. male with past medical history as detailed below who presents with difficulties with erections. Please see problem based charting for detailed assessment and plan.  Past Medical History:  Diagnosis Date   Elevated BP without diagnosis of hypertension    Hx of migraines    Review of Systems:  Negative unless otherwise stated.  Physical Exam:  Vitals:   04/24/24 0915  BP: 119/67  Pulse: 77  Temp: 98.2 F (36.8 C)  TempSrc: Oral  SpO2: 100%  Weight: 233 lb 1.6 oz (105.7 kg)   Constitutional:Appears stated age, well. In no acute distress. Pulm:Normal work of breathing on room air. WUJ:[id number] for extremity edema. Skin:Warm and dry. Neuro:Alert and oriented x3. No focal deficit noted. Psych:Pleasant mood and affect.   Assessment & Plan:   See Encounters Tab for problem based charting.  Erectile disorder Patient complains of 1 week of lack of erections despite desire to obtain one. He states that he was out of the country in [place] for two weeks, and on return, noticed this problem. He has not had nocturnal or spontaneous erections. He had one erection last week but it was not sustained.   His PHQ-9 and GAD-7 are both 0, but he does endorse some stress related to returning to work now that he is back [place]ide. He denies any medications other than a multivitamin, takes no herbals or supplements, no alcohol, tobacco, or illicit substance use. He has pre-diabetes but no other obvious contributing etiologies.   He did experience this one other time about two years ago. He had also traveled to [place] at that time, and on return, had difficulty with having erections. He did not have specific treatment for erectile dysfunction and the issue spontaneously resolved.   He denies concern for STI and has not had any pain, urning, discharge. He asks if unprotected sex can cause this to occur. He is open to STI  testing.  Plan:Will send in 5 sildenafil 50 mg tablets for PRN use, though I do not suspect he will need these long term. Urine ancillary testing sent for STIs and HIV testing collected.  Patient discussed with Dr. Adriane Albe

## 2024-04-24 NOTE — Assessment & Plan Note (Signed)
 Patient complains of 1 week of lack of erections despite desire to obtain one. He states that he was out of the country in [place] for two weeks, and on return, noticed this problem. He has not had nocturnal or spontaneous erections. He had one erection last week but it was not sustained.   His PHQ-9 and GAD-7 are both 0, but he does endorse some stress related to returning to work now that he is back [place]. He denies any medications other than a multivitamin, takes no herbals or supplements, no alcohol, tobacco, or illicit substance use. He has pre-diabetes but no other obvious contributing etiologies.   He did experience this one other time about two years ago. He had also traveled to [place] at that time, and on return, had difficulty with having erections. He did not have specific treatment for erectile dysfunction and the issue spontaneously resolved.   He denies concern for STI and has not had any pain, urning, discharge. He asks if unprotected sex can cause this to occur. He is open to STI testing.  Plan:Will send in 5 sildenafil 50 mg tablets for PRN use, though I do not suspect he will need these long term. Urine ancillary testing sent for STIs and HIV testing collected.

## 2024-04-25 LAB — URINE CYTOLOGY ANCILLARY ONLY
Chlamydia: NEGATIVE
Comment: NEGATIVE
Comment: NEGATIVE
Comment: NORMAL
Neisseria Gonorrhea: NEGATIVE
Trichomonas: NEGATIVE

## 2024-04-25 LAB — HIV ANTIBODY (ROUTINE TESTING W REFLEX): HIV Screen 4th Generation wRfx: NONREACTIVE

## 2024-04-26 ENCOUNTER — Encounter: Payer: Self-pay | Admitting: Internal Medicine

## 2024-04-27 NOTE — Progress Notes (Signed)
 Internal Medicine Clinic Attending  Case discussed with the resident at the time of the visit.  We reviewed the resident's history and exam and pertinent patient test results.  I agree with the assessment, diagnosis, and plan of care documented in the resident's note.

## 2024-09-17 ENCOUNTER — Ambulatory Visit: Payer: Self-pay

## 2024-09-17 ENCOUNTER — Telehealth: Payer: Self-pay | Admitting: *Deleted

## 2024-09-17 NOTE — Telephone Encounter (Signed)
 FYI Only or Action Required?: FYI only for provider.  Patient was last seen in primary care on 04/24/2024 by Addie Perkins, DO.  Called Nurse Triage reporting Chest Pain.  Symptoms began yesterday.  Interventions attempted: Nothing.  Symptoms are: stable.  Triage Disposition: See Physician Within 24 Hours  Patient/caregiver understands and will follow disposition?: Yes     Reason for Disposition  [1] Chest pain lasts > 5 minutes AND [2] occurred > 3 days ago (72 hours) AND [3] NO chest pain or cardiac symptoms now    Pt had chest pain yesterday & none today.  Pt will like to f/u with PCP to evaluate.  Answer Assessment - Initial Assessment Questions 1. LOCATION: Where does it hurt?       Left upper chest  2. RADIATION: Does the pain go anywhere else? (e.g., into neck, jaw, arms, back)     no 3. ONSET: When did the chest pain begin? (Minutes, hours or days)      yesterday 4. PATTERN: Does the pain come and go, or has it been constant since it started?  Does it get worse with exertion?      Comes and goes 5. DURATION: How long does it last (e.g., seconds, minutes, hours)     unknown 6. SEVERITY: How bad is the pain?  (e.g., Scale 1-10; mild, moderate, or severe)     5 or 6/10 7. CARDIAC RISK FACTORS: Do you have any history of heart problems or risk factors for heart disease? (e.g., angina, prior heart attack; diabetes, high blood pressure, high cholesterol, smoker, or strong family history of heart disease)     na 8. PULMONARY RISK FACTORS: Do you have any history of lung disease?  (e.g., blood clots in lung, asthma, emphysema, birth control pills)     na 9. CAUSE: What do you think is causing the chest pain?     unknown 10. OTHER SYMPTOMS: Do you have any other symptoms? (e.g., dizziness, nausea, vomiting, sweating, fever, difficulty breathing, cough)       no 11. PREGNANCY: Is there any chance you are pregnant? When was your last menstrual period?        na  Protocols used: Chest Pain-A-AH

## 2024-09-17 NOTE — Telephone Encounter (Signed)
 Patient was on the line and says he has no pain now, call dropped before I could finish assessment. Called back, left VM to return the call.   Copied from CRM #[id number]. Topic: Clinical - Red Word Triage >> Sep 17, 2024  9:27 AM Cherylann RAMAN wrote: Red Word that prompted transfer to Nurse Triage: Patient is experiencing chest pain.

## 2024-09-17 NOTE — Telephone Encounter (Signed)
 Call to patient.  Message left that if he is having Chest Pain will need to go to the ER.  Patient was incorrectly scheduled an appointment with the Diabetes Educator for tomorrow.  Appointment  has been cancelled and moved to a doctor slot on tomorrow.  Problems reaching patient..  Message have been left x 2 to go to the ER.

## 2024-09-18 ENCOUNTER — Ambulatory Visit (INDEPENDENT_AMBULATORY_CARE_PROVIDER_SITE_OTHER)

## 2024-09-18 ENCOUNTER — Ambulatory Visit

## 2024-09-18 ENCOUNTER — Ambulatory Visit: Payer: Self-pay | Admitting: Dietician

## 2024-09-18 VITALS — BP 118/75 | HR 65 | Temp 98.1°F | Ht 67.0 in | Wt 234.2 lb

## 2024-09-18 DIAGNOSIS — R7303 Prediabetes: Secondary | ICD-10-CM | POA: Diagnosis not present

## 2024-09-18 DIAGNOSIS — R079 Chest pain, unspecified: Secondary | ICD-10-CM | POA: Insufficient documentation

## 2024-09-18 DIAGNOSIS — M25562 Pain in left knee: Secondary | ICD-10-CM | POA: Diagnosis not present

## 2024-09-18 DIAGNOSIS — M7712 Lateral epicondylitis, left elbow: Secondary | ICD-10-CM | POA: Insufficient documentation

## 2024-09-18 NOTE — Assessment & Plan Note (Signed)
 Patient reported that he did have a knee injury in July and was seen in urgent care where he was given a course of diclofenac which was initially helpful.  He states that his pain has significantly improved however when he is squatting or bending at work he does still experience pain on the lateral aspect of the knee.  Suspect this is a component of tendinitis.  Low concern for knee instability and ligamental injury at this time due to the physical exam being without concerning features and description of pain.  He was told that he can use diclofenac gel on the knee as well as along with rotating Tylenol and ibuprofen as needed.

## 2024-09-18 NOTE — Progress Notes (Signed)
 Established Patient Office Visit  Subjective   Patient ID: Shane Gillespie, male    DOB: 1981/09/26  Age: 43 y.o. MRN: [id number]  Chief Complaint  Patient presents with  . Chest Pain    Pt states he has chest pain 2 days ago, pain feels like it hurts to swallow.  Not having any pain this morning.     The patient presents today for evaluation of chest pain.  See problem-based assessment and plan below.    Review of Systems  Constitutional:  Negative for chills, fever and weight loss.  Eyes:  Negative for blurred vision and double vision.  Respiratory:  Negative for cough.   Cardiovascular:  Negative for chest pain, palpitations and leg swelling.       Denies tachycardia or palpitations.   Gastrointestinal:  Negative for abdominal pain, diarrhea, heartburn, nausea and vomiting.  Musculoskeletal:  Positive for joint pain.      Objective:     BP 118/75 (BP Location: Left Arm, Patient Position: Sitting, Cuff Size: Normal)   Pulse 65   Temp 98.1 F (36.7 C) (Oral)   Ht 5' 7 (1.702 m)   Wt 234 lb 3.2 oz (106.2 kg)   SpO2 96%   BMI 36.68 kg/m    Const: Awake, alert in NAD HENT: Normocephalic, atraumatic, mucus membranes moist Card: RRR, No MRG, No pitting edema on LE's bilaterally  Resp: LCTAB, no increased work of breathing Abd: Soft, NTND, Extremities: Warm, dry. RUE with tenderness to palpation of the lateral epicondyle. No erythema or warmth noted LLE with mild tenderness to palpation of left lateral knee. No edema, erythema, or warmth  No results found for any visits on 09/18/24.  Last lipids Lab Results  Component Value Date   CHOL 154 09/07/2023   HDL 52 09/07/2023   LDLCALC 88 09/07/2023   TRIG 71 09/07/2023   CHOLHDL 3.0 09/07/2023   Last hemoglobin A1c Lab Results  Component Value Date   HGBA1C 5.8 (A) 01/31/2024      The 10-year ASCVD risk score (Arnett DK, et al., 2019) is: 2.8%    Assessment & Plan:   Problem List Items Addressed This  Visit       Musculoskeletal and Integument   Lateral epicondylitis of left elbow   Patient reports pain over the left lateral aspect of the elbow and down over the brachioradialis.  He reports that it does not hurt at rest, he is doing extremely well moving boxes at work he does have significant pain.  Discussed with patient this is likely lateral epicondylitis due to overuse.  He was instructed to use Voltaren gel on the counter as needed and to wear a brace.  The patient verbalized understanding.        Other   Prediabetes   Relevant Orders   Hemoglobin A1c   Chest pain with low risk for cardiac etiology - Primary   Patient reported that he had an episode of chest pain 2 days ago lasting couple hours and feeling as if something was stuck in his chest.  He was not eating before, just reports that it was a similar sensation. He denies tachycardia, palpitations, shortness of breath, cough, lightheadedness, dizziness, epigastric burning, or dyspepsia.  He was at work when it happened and it did resolve over time the following few hours.  He was started on sildenafil  in April of this year however the patient reports that he never took it. He states this has happened in the past,  maybe only 2-3 times over the past few years. His heart score is 1.  Today, he is chest pain-free.  At this time low concern for cardiac etiology.   Instructed the patient that if he becomes symptomatic, or this starts happening more frequently he should call the office for further evaluation.  Lipid panel has been in normal range in the past and A1c was slightly elevated at 5.8 in February.  Will recheck this today as he is due for both and for further risk stratification.      Relevant Orders   Lipid Profile   Hemoglobin A1c   Acute pain of left knee   Patient reported that he did have a knee injury in July and was seen in urgent care where he was given a course of diclofenac which was initially helpful.  He states that  his pain has significantly improved however when he is squatting or bending at work he does still experience pain on the lateral aspect of the knee.  Suspect this is a component of tendinitis.  Low concern for knee instability and ligamental injury at this time due to the physical exam being without concerning features and description of pain.  He was told that he can use diclofenac gel on the knee as well as along with rotating Tylenol and ibuprofen as needed.       No follow-ups on file.    Schuyler Novak, DO

## 2024-09-18 NOTE — Assessment & Plan Note (Addendum)
 Patient reported that he had an episode of chest pain 2 days ago lasting couple hours and feeling as if something was stuck in his chest.  He was not eating before, just reports that it was a similar sensation. He denies tachycardia, palpitations, shortness of breath, cough, lightheadedness, dizziness, epigastric burning, or dyspepsia.  He was at work when it happened and it did resolve over time the following few hours.  He was started on sildenafil  in April of this year however the patient reports that he never took it. He states this has happened in the past, maybe only 2-3 times over the past few years. His heart score is 1.  Today, he is chest pain-free.  At this time low concern for cardiac etiology.   Instructed the patient that if he becomes symptomatic, or this starts happening more frequently he should call the office for further evaluation.  Lipid panel has been in normal range in the past and A1c was slightly elevated at 5.8 in February.  Will recheck this today as he is due for both and for further risk stratification.

## 2024-09-18 NOTE — Progress Notes (Deleted)
 CC: ***  HPI: [name] is a 43 y.o. male living with a history stated below and presents today for ***. Please see problem based assessment and plan for additional details.  Past Medical History:  Diagnosis Date   Elevated BP without diagnosis of hypertension    Hx of migraines     Current Outpatient Medications on File Prior to Visit  Medication Sig Dispense Refill   Multiple Vitamin (MULTIVITAMIN WITH MINERALS) TABS tablet Take 1 tablet by mouth daily.     sildenafil  (VIAGRA ) 50 MG tablet Take 1 tablet (50 mg total) by mouth as needed for erectile dysfunction. 5 tablet 0   No current facility-administered medications on file prior to visit.    Family History  Problem Relation Age of Onset   Osteoarthritis Mother    Healthy Mother    Stroke Father    Hypertension Father    Healthy Father     Social History   Socioeconomic History   Marital status: Married    Spouse name: Not on file   Number of children: 3   Years of education: Not on file   Highest education level: Not on file  Occupational History   Not on file  Tobacco Use   Smoking status: Never   Smokeless tobacco: Never  Vaping Use   Vaping status: Never Used  Substance and Sexual Activity   Alcohol use: No   Drug use: No   Sexual activity: Not Currently    Partners: Female  Other Topics Concern   Not on file  Social History Narrative   Not on file   Social Drivers of Health   Financial Resource Strain: Low Risk  (07/09/2019)   Overall Financial Resource Strain (CARDIA)    Difficulty of Paying Living Expenses: Not hard at all  Food Insecurity: No Food Insecurity (09/07/2023)   Hunger Vital Sign    Worried About Running Out of Food in the Last Year: Never true    Ran Out of Food in the Last Year: Never true  Transportation Needs: No Transportation Needs (07/09/2019)   PRAPARE - Administrator, Civil Service (Medical): No    Lack of Transportation (Non-Medical): No  Physical  Activity: Insufficiently Active (07/09/2019)   Exercise Vital Sign    Days of Exercise per Week: 3 days    Minutes of Exercise per Session: 30 min  Stress: Stress Concern Present (07/09/2019)   [employer] of Occupational Health - Occupational Stress Questionnaire    Feeling of Stress : To some extent  Social Connections: Moderately Integrated (07/09/2019)   Social Connection and Isolation Panel    Frequency of Communication with Friends and Family: Three times a week    Frequency of Social Gatherings with Friends and Family: Twice a week    Attends Religious Services: More than 4 times per year    Active Member of [employer] or Organizations: No    Attends Banker Meetings: Never    Marital Status: Married  Catering manager Violence: Not At Risk (07/09/2019)   Humiliation, Afraid, Rape, and Kick questionnaire    Fear of Current or Ex-Partner: No    Emotionally Abused: No    Physically Abused: No    Sexually Abused: No    Review of Systems: ROS negative except for what is noted on the assessment and plan.  There were no vitals filed for this visit.  Physical Exam  Physical Exam: Constitutional: well-appearing *** sitting in ***, in no acute  distress HENT: normocephalic atraumatic, mucous membranes moist Eyes: conjunctiva non-erythematous Neck: supple Cardiovascular: regular rate and rhythm, no m/r/g Pulmonary/Chest: normal work of breathing on room air, lungs clear to auscultation bilaterally Abdominal: soft, non-tender, non-distended MSK: *** Neurological: alert & oriented x 3, 5/5 strength in bilateral upper and lower extremities, normal gait Skin: warm and dry Psych: ***  Assessment & Plan:   Assessment & Plan   No orders of the defined types were placed in this encounter.  PMH: Prediabetes, obesity  Acute visit for chest pain ***  HEART SCORE    No follow-ups on file.   Patient {GC/GE:[id number]::discussed with,seen with} Dr.  {WJFZD:[id number]::Tpoopjfd,Z. Hoffman,Winfrey,Narendra,Chun,Chambliss,Lau,Machen}  Ozell Nearing, D.O. [facility] Internal Medicine, PGY-3 Phone: [phone] Date 09/18/2024 Time 7:41 AM

## 2024-09-18 NOTE — Patient Instructions (Signed)
 Thank you, Shane Gillespie, for allowing us  to provide your care today. Today we discussed . . .  > Chest Pain       - With the chest pain you described, we have low suspicion that this is your heart. If it begins happening more frequently, or you develop shortness of breath, dizziness, lightheadedness, or significant chest pain, please go to the ER or call the office.       - We also ordered your cholesterol panel and A1C today > Elbow Pain       - Your elbow pain is called Tennis Elbow.       - You can get Voltaren Gel over the counter at a pharmacy and place it on your elbow and knee up to 4 times a day as needed       - You can also try a brace for the Elbow. You can also get this at the local pharmacy, look for the brace that says tennis elbow. > Knee Pain       - You can place a knee brace on your knee and use the voltaren gel as well.        - If it does not get significantly better, we can refer you to sports medicine if you would like to see them.       - You can take ibuprofen and tylenol as needed for the pain in your joints.    I have ordered the following labs for you:   Lab Orders         Lipid Profile         Hemoglobin A1c       Follow up: as needed.     Remember:  Should you have any questions or concerns please call the internal medicine clinic at [phone].     Schuyler Novak, DO [facility]

## 2024-09-18 NOTE — Assessment & Plan Note (Addendum)
 Patient reports pain over the left lateral aspect of the elbow and down over the brachioradialis.  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 Discussed with patient this is likely lateral epicondylitis due to overuse.  He was instructed to use Voltaren gel on the counter as needed and to wear a brace.  The patient verbalized understanding.

## 2024-09-19 LAB — LIPID PANEL
Chol/HDL Ratio: 3.1 ratio (ref 0.0–5.0)
Cholesterol, Total: 172 mg/dL (ref 100–199)
HDL: 56 mg/dL (ref >39)
LDL Chol Calc (NIH): 104 mg/dL — ABNORMAL HIGH (ref 0–99)
Triglycerides: 63 mg/dL (ref 0–149)
VLDL Cholesterol Cal: 12 mg/dL (ref 5–40)

## 2024-09-19 LAB — HEMOGLOBIN A1C
Est. average glucose Bld gHb Est-mCnc: 123 mg/dL
Hgb A1c MFr Bld: 5.9 % — ABNORMAL HIGH (ref 4.8–5.6)

## 2024-09-20 ENCOUNTER — Ambulatory Visit: Payer: Self-pay

## 2024-09-20 NOTE — Progress Notes (Signed)
 LDL minimally elevated at 895. 10 year ASCVD risk is 2.8%. No pharmacologic intervention necessary at this time. Instructed patient to manage this best with dietary modification.  A1C 5.8, consistent with values since 01/13/2023. Pt remains in pre-diabetic range.

## 2024-09-23 NOTE — Progress Notes (Signed)
 Internal Medicine Clinic Attending  I was physically present during the key portions of the resident provided service and participated in the medical decision making of patient's management care. I reviewed pertinent patient test results.  The assessment, diagnosis, and plan were formulated together and I agree with the documentation in the resident's note.  Rosan Dayton BROCKS, DO

## 2024-11-11 ENCOUNTER — Ambulatory Visit: Payer: Self-pay

## 2024-11-11 NOTE — Telephone Encounter (Signed)
 FYI Only or Action Required?: FYI only for provider: appointment scheduled on 11/19.  Patient was last seen in primary care on 09/18/2024 by Myrna Bitters, DO.  Called Nurse Triage reporting Arm Pain.  Symptoms began several weeks ago.  Symptoms are: gradually worsening.  Triage Disposition: See PCP When Office is Open (Within 3 Days)  Patient/caregiver understands and will follow disposition?: Yes, will follow disposition  Pt was disconnected prior to NR transfer. RN placed outbound call to pt.   Copied from CRM #[id number]. Topic: Clinical - Red Word Triage >> Nov 11, 2024  8:16 AM Miquel SAILOR wrote: Red Word that prompted transfer to Nurse Triage: PT had visit on 09/24 for RT LT arm pain. intructed to get medication over counter but still in ain. Can not pick up stuff in sever pain Reason for Disposition  [1] MODERATE pain (e.g., interferes with normal activities) AND [2] present > 3 days  Answer Assessment - Initial Assessment Questions 1. ONSET: When did the pain start?     About about a month ago 2. LOCATION: Where is the pain located?     Middle parts of arm, bilateral 3. PAIN: How bad is the pain? (Scale 0-10; or none, mild, moderate, severe)     8 4. WORK OR EXERCISE: Has there been any recent work or exercise that involved this part of the body?     Work, does heavy lifting 5. CAUSE: What do you think is causing the arm pain?     Unsure, does a lot of heavy lifting for work 6. OTHER SYMPTOMS: Do you have any other symptoms? (e.g., neck pain, swelling, rash, fever, numbness, weakness)     denies  Protocols used: Arm Pain-A-AH

## 2024-11-13 ENCOUNTER — Ambulatory Visit

## 2024-11-13 VITALS — BP 120/71 | HR 69 | Temp 97.9°F | Ht 67.0 in | Wt 232.2 lb

## 2024-11-13 DIAGNOSIS — S46212A Strain of muscle, fascia and tendon of other parts of biceps, left arm, initial encounter: Secondary | ICD-10-CM | POA: Diagnosis not present

## 2024-11-13 DIAGNOSIS — S46219A Strain of muscle, fascia and tendon of other parts of biceps, unspecified arm, initial encounter: Secondary | ICD-10-CM | POA: Insufficient documentation

## 2024-11-13 DIAGNOSIS — S46211A Strain of muscle, fascia and tendon of other parts of biceps, right arm, initial encounter: Secondary | ICD-10-CM | POA: Diagnosis not present

## 2024-11-13 NOTE — Patient Instructions (Signed)
 Thank you, Shane Gillespie for allowing us  to provide your care today. Today we discussed the following:  - Obtain ibuprofen (Advil) over the counter and take this for 7 days with food. You will take 400 mg in the morning and 400 mg in the evening.  - Use ice after a day's work, and heat on the days that you are resting and not at work.  -  Use IcyHot, Lidocaine  patches, and Voltaren gel as needed - Please let us  know and we can put in a PT referral if things are not improving in about a month    Follow up: As needed    Remember: Please let us  know in about a month if your symptoms haven't calmed down so we can put in a PT referral for you   Should you have any questions or concerns please call the Internal Medicine Clinic at [phone].     Doyal Miyamoto, MD [facility]

## 2024-11-13 NOTE — Progress Notes (Signed)
 Internal Medicine Clinic Attending  I was physically present during the key portions of the resident provided service and participated in the medical decision making of patient's management care. I reviewed pertinent patient test results.  The assessment, diagnosis, and plan were formulated together and I agree with the documentation in the resident's note.  Jeanelle Layman CROME, MD

## 2024-11-13 NOTE — Progress Notes (Signed)
 Patient name: Shane Gillespie Date of birth: 21-Jun-1981 Date of visit: 11/13/24  Type of visit: Acute Office Visit   Subjective   Chief concern:  Chief Complaint  Patient presents with   Acute Visit    Per e2c2 :reporting Arm Pain.   Symptoms began several weeks ago.   Symptoms are: gradually worsening.   Triage Disposition: See PCP When Office is Open (Within 3 Days) PT had visit on 09/24 for RT LT arm pain. intructed to get medication over counter but still in ain. Can not pick up stuff in sever pain    Jermone Dermody is a 43 y.o. male with a history of prediabetes and erectile dysfunction who presents to [facility] for evaluation of bilateral biceps pain.  Patient reports that he has been having pain in his left bicep for months and that his right bicep began experiencing pain a few weeks ago as well.  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  He denies any numbness or tingling down either arm.  He points to the mid belly of both biceps as to where his pain usually is.  Right now he says the pain is about a 1/10, though when he has been lifting a lot, the pain can feel like 8/10.  He was last seen in the [facility] in September 2025 by Dr. Myrna for lateral epicondylitis of his elbow and was advised to try using an arm band.  He reports that he did use this and though his elbow no longer is experiencing any pain, this did not help his bicep pain.  He denies decreased grip strength or decreased ability to lift and carry heavy boxes, the reports pain following.  He does have upcoming time off of about 5 days over the Thanksgiving holiday next week.   ROS: Denies headaches, dizziness, fever, chills, runny nose, sore throat, vision changes, hearing changes, chest pain, shortness of breath, difficulty breathing, nausea, vomiting, abdominal pain. Denies increased urinary frequency, pain with urination, constipation or diarrhea. No recent falls.   Patient  Active Problem List   Diagnosis Date Noted   Biceps muscle strain 11/13/2024   Erectile disorder 04/24/2024   Prediabetes 09/07/2023   Obesity (BMI 35.0-39.9 without comorbidity) 02/18/2022     History reviewed. No pertinent surgical history.   Current Outpatient Medications  Medication Instructions   Multiple Vitamin (MULTIVITAMIN WITH MINERALS) TABS tablet 1 tablet, Daily   sildenafil  (VIAGRA ) 50 mg, Oral, As needed    Social History   Tobacco Use   Smoking status: Never   Smokeless tobacco: Never  Vaping Use   Vaping status: Never Used  Substance Use Topics   Alcohol use: No   Drug use: No      Objective  Today's Vitals   11/13/24 0908  BP: 120/71  Pulse: 69  Temp: 97.9 F (36.6 C)  TempSrc: Oral  SpO2: 99%  Weight: 232 lb 3.2 oz (105.3 kg)  Height: 5' 7 (1.702 m)  PainSc: 8   PainLoc: Arm  Body mass index is 36.37 kg/m.   Physical Exam:   Constitutional: well-appearing male sitting in exam chair, in no acute distress. Ambulates without use of assistance device  HEENT: normocephalic atraumatic, mucous membranes moist Eyes: conjunctiva non-erythematous Cardiovascular: regular rate and rhythm Pulmonary/Chest: normal work of breathing on room air, lungs clear to auscultation bilaterally Abdominal: soft, non-tender, non-distended MSK: normal bulk and tone. Bilateral arm: Full AROM of shoulder in all  quadrants without pain; strength 5/5 elbow flex/ext and without pain, strength 5/5 IR though notes some mild pain mid-belly of biceps. Denies tenderness to palpation of biceps tendon or shoulder region; endorses mild tenderness to palpation of mid-belly of biceps on bilateral arms. No divots or depression palpated through length of the bicep on either arm. Grip strength 5/5, triceps strength 5/5 Neurological: alert & oriented x 3 Skin: warm and dry Psych: mood calm, behavior normal, thought content normal, judgement normal      The 10-year ASCVD risk score  (Arnett DK, et al., 2019) is: 2.9%   Values used to calculate the score:     Age: 37 years     Clincally relevant sex: Male     Is Non-Hispanic African American: Yes     Diabetic: No     Tobacco smoker: No     Systolic Blood Pressure: 120 mmHg     Is BP treated: No     HDL Cholesterol: 56 mg/dL     Total Cholesterol: 172 mg/dL      Assessment & Plan  Problem List Items Addressed This Visit       Musculoskeletal and Integument   Biceps muscle strain - Primary   Patient reports months long history of left mid belly biceps pain and weeks long history of right mid belly biceps pain.  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  He was previously seen in September 2025 for lateral epicondylitis and was advised to use an arm sleeve which he did try, though states that this did not alleviate his bicep pain.  On physical exam he has full AROM of shoulder in all quadrants without pain; strength 5/5 elbow flex/ext and without pain, strength 5/5 IR though notes some mild pain mid-belly of biceps. Denies tenderness to palpation of biceps tendon or shoulder region; endorses mild tenderness to palpation of mid-belly of biceps on bilateral arms. No divots or depression palpated through length of the bicep on either arm. Grip strength 5/5, triceps strength 5/5.  Suspect that this is an overuse injury of the belly of his biceps on bilateral arms.  He does have some time off of work of about 5 days next week for Thanksgiving.  We discussed conservative management and I would like him to take a 7-day course of ibuprofen 400 mg 2-3 times daily with food, use IcyHot and lidocaine  patches as needed on bilateral biceps, after a day of work to apply ice for about 20 minutes, and on his off days can try heat and see which 1 feels better.  I have advised him to gently massage the area after a long day of work, though not to the point that  it causes pain.  We discussed that if his symptoms do not improve after about 3 to 4 weeks then he will contact us  and we can put in a referral for physical therapy. - Ice or heat prn - IcyHot, Lidocaine  patches, Voltaren gel prn - light massaging of biceps region - 7-day course of ibuprofen 400 mg 2-3 times daily with food - If no improvement with conservative measures, he will let us  know and we can place an order for PT       Return if symptoms worsen or fail to improve.  Patient discussed with Dr. Jeanelle, who also saw and evaluated the patient.  Doyal Miyamoto, MD [facility]  PGY-1  11/13/2024, 10:30 AM

## 2024-11-13 NOTE — Assessment & Plan Note (Signed)
 Patient reports months long history of left mid belly biceps pain and weeks long history of right mid belly biceps pain.  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  He was previously seen in September 2025 for lateral epicondylitis and was advised to use an arm sleeve which he did try, though states that this did not alleviate his bicep pain.  On physical exam he has full AROM of shoulder in all quadrants without pain; strength 5/5 elbow flex/ext and without pain, strength 5/5 IR though notes some mild pain mid-belly of biceps. Denies tenderness to palpation of biceps tendon or shoulder region; endorses mild tenderness to palpation of mid-belly of biceps on bilateral arms. No divots or depression palpated through length of the bicep on either arm. Grip strength 5/5, triceps strength 5/5.  Suspect that this is an overuse injury of the belly of his biceps on bilateral arms.  He does have some time off of work of about 5 days next week for Thanksgiving.  We discussed conservative management and I would like him to take a 7-day course of ibuprofen 400 mg 2-3 times daily with food, use IcyHot and lidocaine  patches as needed on bilateral biceps, after a day of work to apply ice for about 20 minutes, and on his off days can try heat and see which 1 feels better.  I have advised him to gently massage the area after a long day of work, though not to the point that it causes pain.  We discussed that if his symptoms do not improve after about 3 to 4 weeks then he will contact us  and we can put in a referral for physical therapy. - Ice or heat prn - IcyHot, Lidocaine  patches, Voltaren gel prn - light massaging of biceps region - 7-day course of ibuprofen 400 mg 2-3 times daily with food - If no improvement with conservative measures, he will let us  know and we can place an order for PT
